# Patient Record
Sex: Female | Born: 1985 | Race: Black or African American | Hispanic: No | Marital: Single | Smoking: Current some day smoker
Health system: Southern US, Community
[De-identification: ages and names within clinical notes are randomized; demographics above are authoritative.]

## PROBLEM LIST (undated history)

## (undated) DIAGNOSIS — R87619 Unspecified abnormal cytological findings in specimens from cervix uteri: Secondary | ICD-10-CM

## (undated) DIAGNOSIS — I10 Essential (primary) hypertension: Secondary | ICD-10-CM

## (undated) DIAGNOSIS — D219 Benign neoplasm of connective and other soft tissue, unspecified: Secondary | ICD-10-CM

## (undated) DIAGNOSIS — D649 Anemia, unspecified: Secondary | ICD-10-CM

## (undated) HISTORY — DX: Essential (primary) hypertension: I10

## (undated) HISTORY — DX: Unspecified abnormal cytological findings in specimens from cervix uteri: R87.619

## (undated) HISTORY — DX: Anemia, unspecified: D64.9

## (undated) HISTORY — PX: SHOULDER ARTHROSCOPY: SHX128

## (undated) HISTORY — PX: LEEP: SHX91

## (undated) HISTORY — PX: MANDIBLE FRACTURE SURGERY: SHX706

---

## 2020-12-18 ENCOUNTER — Encounter (HOSPITAL_COMMUNITY): Payer: Self-pay

## 2020-12-18 ENCOUNTER — Emergency Department (HOSPITAL_COMMUNITY)
Admission: EM | Admit: 2020-12-18 | Discharge: 2020-12-18 | Disposition: A | Discharge status: Home / Self Care | Payer: PRIVATE HEALTH INSURANCE | Attending: Emergency Medicine | Admitting: Emergency Medicine

## 2020-12-18 ENCOUNTER — Other Ambulatory Visit: Payer: Self-pay

## 2020-12-18 DIAGNOSIS — R52 Pain, unspecified: Secondary | ICD-10-CM | POA: Diagnosis not present

## 2020-12-18 DIAGNOSIS — Z20822 Contact with and (suspected) exposure to covid-19: Secondary | ICD-10-CM | POA: Insufficient documentation

## 2020-12-18 NOTE — ED Triage Notes (Signed)
Pt reports having sore throat and body aches. Patient states that she had a COVID exposure and is requesting a test.

## 2020-12-18 NOTE — ED Provider Notes (Signed)
[facility] DEPT Provider Note   CSN: [id number] Arrival date & time: 12/18/20  2036     History No chief complaint on file.   Bernisha Verma is a 35 y.o. female.  35 y.o female with no PMH presents to the ED requesting COVID-19 testing.  According to patient, she is currently employed as a custodian at a school, states people that work with her tested positive for COVID-19 over the weekend.  She was last exposed to them last Friday.  She has been working on her own in the last couple days.  She does endorse overall body aches, which she has been taking naproxen for.  She does not have any URI symptoms at this time such as nasal congestion, cough, shortness of breath or chest pain.   The history is provided by the patient.       History reviewed. No pertinent past medical history.  There are no problems to display for this patient.   History reviewed. No pertinent surgical history.   OB History   No obstetric history on file.     History reviewed. No pertinent family history.     Home Medications Prior to Admission medications   Not on File    Allergies    Patient has no known allergies.  Review of Systems   Review of Systems  Constitutional: Negative for fever.  HENT: Negative for sore throat.   Respiratory: Negative for shortness of breath.   Cardiovascular: Negative for chest pain.  Gastrointestinal: Negative for abdominal pain, nausea and vomiting.  Genitourinary: Negative for flank pain.  Musculoskeletal: Negative for back pain.  Neurological: Negative for light-headedness and headaches.  All other systems reviewed and are negative.   Physical Exam Updated Vital Signs BP 128/82 (BP Location: Right Arm)   Pulse (!) 103   Temp 98.2 F (36.8 C) (Oral)   Resp 15   Ht 5\' 6"  (1.676 m)   Wt 55.3 kg   SpO2 100%   BMI 19.69 kg/m   Physical Exam Vitals and nursing note reviewed.  Constitutional:      Appearance:  Normal appearance. She is not ill-appearing.  HENT:     Head: Normocephalic and atraumatic.     Nose: Nose normal.     Mouth/Throat:     Mouth: Mucous membranes are moist.     Comments: Oropharynx without any erythema, no tonsillar exudates noted. Cardiovascular:     Rate and Rhythm: Normal rate.  Pulmonary:     Effort: Pulmonary effort is normal.     Comments: No wheezing, rhonchi, rales. Abdominal:     General: Abdomen is flat.     Tenderness: There is no abdominal tenderness. There is no right CVA tenderness or left CVA tenderness.  Musculoskeletal:     Cervical back: Normal range of motion and neck supple.  Skin:    General: Skin is warm and dry.  Neurological:     Mental Status: She is alert and oriented to person, place, and time.     ED Results / Procedures / Treatments   Labs (all labs ordered are listed, but only abnormal results are displayed) Labs Reviewed  SARS CORONAVIRUS 2 (TAT 6-24 HRS)    EKG None  Radiology No results found.  Procedures Procedures   Medications Ordered in ED Medications - No data to display  ED Course  I have reviewed the triage vital signs and the nursing notes.  Pertinent labs & imaging results that were available during  my care of the patient were reviewed by me and considered in my medical decision making (see chart for details).    MDM Rules/Calculators/A&P   Patient here for COVID-19 testing, reports exposure on Friday were both of her coworkers tested positive.  She does endorse body aches, has been taking naproxen for her complaints along with for recurrent pain of her fibroids.  She does not have any fever, no chest pain, no shortness of breath.  During evaluation she is overall well-appearing, vitals are within normal limits, she is satting at 100% on room air without any signs of tachypnea or tachycardia.  She is requesting a note in order to return back to work.  This was provided for patient.  She was also tested for  COVID-19 on todays visit.  Patient stable for discharge.  Portions of this note were generated with L[employer]. Dictation errors may occur despite best attempts at proofreading.  Final Clinical Impression(s) / ED Diagnoses Final diagnoses:  Encounter for laboratory testing for COVID-19 virus    Rx / DC Orders ED Discharge Orders    None       Janeece Fitting, Hershal Coria 12/18/20 2154    Milton Ferguson, MD 12/24/20 1222

## 2020-12-18 NOTE — Discharge Instructions (Addendum)
You were tested for COVID-19 on today's visit, these results should be available to you in the next 24 to 48 hours.  You were provided with a note in order to return back to work after your test results are given.  You may continue to treat your symptoms at home with symptomatic treatment such as Tylenol, increasing hydration.  If you are experience any chest pain, shortness of breath please return to the Emergency Department

## 2020-12-19 ENCOUNTER — Ambulatory Visit: Payer: Self-pay

## 2020-12-19 LAB — SARS CORONAVIRUS 2 (TAT 6-24 HRS): SARS Coronavirus 2: NEGATIVE

## 2021-04-15 ENCOUNTER — Emergency Department (HOSPITAL_COMMUNITY)
Admission: EM | Admit: 2021-04-15 | Discharge: 2021-04-16 | Disposition: A | Discharge status: Home / Self Care | Payer: Medicaid - Out of State | Attending: Emergency Medicine | Admitting: Emergency Medicine

## 2021-04-15 ENCOUNTER — Encounter (HOSPITAL_COMMUNITY): Payer: Self-pay | Admitting: Emergency Medicine

## 2021-04-15 ENCOUNTER — Other Ambulatory Visit: Payer: Self-pay

## 2021-04-15 DIAGNOSIS — N760 Acute vaginitis: Secondary | ICD-10-CM | POA: Diagnosis not present

## 2021-04-15 DIAGNOSIS — F17210 Nicotine dependence, cigarettes, uncomplicated: Secondary | ICD-10-CM | POA: Insufficient documentation

## 2021-04-15 DIAGNOSIS — R079 Chest pain, unspecified: Secondary | ICD-10-CM | POA: Insufficient documentation

## 2021-04-15 DIAGNOSIS — Z20822 Contact with and (suspected) exposure to covid-19: Secondary | ICD-10-CM | POA: Diagnosis not present

## 2021-04-15 DIAGNOSIS — B9689 Other specified bacterial agents as the cause of diseases classified elsewhere: Secondary | ICD-10-CM | POA: Diagnosis not present

## 2021-04-15 DIAGNOSIS — R0981 Nasal congestion: Secondary | ICD-10-CM | POA: Diagnosis not present

## 2021-04-15 DIAGNOSIS — R059 Cough, unspecified: Secondary | ICD-10-CM | POA: Diagnosis not present

## 2021-04-15 DIAGNOSIS — R197 Diarrhea, unspecified: Secondary | ICD-10-CM | POA: Diagnosis not present

## 2021-04-15 DIAGNOSIS — R1110 Vomiting, unspecified: Secondary | ICD-10-CM | POA: Insufficient documentation

## 2021-04-15 DIAGNOSIS — R070 Pain in throat: Secondary | ICD-10-CM | POA: Insufficient documentation

## 2021-04-15 DIAGNOSIS — R102 Pelvic and perineal pain: Secondary | ICD-10-CM | POA: Insufficient documentation

## 2021-04-15 DIAGNOSIS — N898 Other specified noninflammatory disorders of vagina: Secondary | ICD-10-CM | POA: Diagnosis present

## 2021-04-15 HISTORY — DX: Benign neoplasm of connective and other soft tissue, unspecified: D21.9

## 2021-04-15 LAB — COMPREHENSIVE METABOLIC PANEL
ALT: 22 U/L (ref 0–44)
AST: 28 U/L (ref 15–41)
Albumin: 4.2 g/dL (ref 3.5–5.0)
Alkaline Phosphatase: 52 U/L (ref 38–126)
Anion gap: 10 (ref 5–15)
BUN: 11 mg/dL (ref 6–20)
CO2: 28 mmol/L (ref 22–32)
Calcium: 9.4 mg/dL (ref 8.9–10.3)
Chloride: 103 mmol/L (ref 98–111)
Creatinine, Ser: 0.59 mg/dL (ref 0.44–1.00)
GFR, Estimated: >60 mL/min (ref >60)
Glucose, Bld: 92 mg/dL (ref 70–99)
Potassium: 3.8 mmol/L (ref 3.5–5.1)
Sodium: 141 mmol/L (ref 135–145)
Total Bilirubin: 0.5 mg/dL (ref 0.3–1.2)
Total Protein: 7.7 g/dL (ref 6.5–8.1)

## 2021-04-15 LAB — URINALYSIS, ROUTINE W REFLEX MICROSCOPIC
Bilirubin Urine: NEGATIVE
Glucose, UA: NEGATIVE mg/dL
Hgb urine dipstick: NEGATIVE
Ketones, ur: NEGATIVE mg/dL
Leukocytes,Ua: NEGATIVE
Nitrite: NEGATIVE
Specific Gravity, Urine: >1.03 — ABNORMAL HIGH (ref 1.005–1.030)
pH: 6 (ref 5.0–8.0)

## 2021-04-15 LAB — URINALYSIS, MICROSCOPIC (REFLEX)

## 2021-04-15 LAB — CBC
HCT: 34.3 % — ABNORMAL LOW (ref 36.0–46.0)
Hemoglobin: 11 g/dL — ABNORMAL LOW (ref 12.0–15.0)
MCH: 31.1 pg (ref 26.0–34.0)
MCHC: 32.1 g/dL (ref 30.0–36.0)
MCV: 96.9 fL (ref 80.0–100.0)
Platelets: 373 10*3/uL (ref 150–400)
RBC: 3.54 MIL/uL — ABNORMAL LOW (ref 3.87–5.11)
RDW: 19.1 % — ABNORMAL HIGH (ref 11.5–15.5)
WBC: 7.9 10*3/uL (ref 4.0–10.5)
nRBC: 0 % (ref 0.0–0.2)

## 2021-04-15 LAB — RESP PANEL BY RT-PCR (FLU A&B, COVID) ARPGX2
Influenza A by PCR: NEGATIVE
Influenza B by PCR: NEGATIVE
SARS Coronavirus 2 by RT PCR: NEGATIVE

## 2021-04-15 LAB — I-STAT BETA HCG BLOOD, ED (MC, WL, AP ONLY): I-stat hCG, quantitative: <5 m[IU]/mL (ref <5)

## 2021-04-15 LAB — LIPASE, BLOOD: Lipase: 37 U/L (ref 11–51)

## 2021-04-15 NOTE — ED Triage Notes (Signed)
Pt states she's had cough/runny nose/headache x 1.5 weeks. Also c/o vaginal d/c that has a foul odor. Alert and oriented.

## 2021-04-16 ENCOUNTER — Emergency Department (HOSPITAL_COMMUNITY): Payer: Medicaid - Out of State

## 2021-04-16 LAB — WET PREP, GENITAL
Sperm: NONE SEEN
Trich, Wet Prep: NONE SEEN
Yeast Wet Prep HPF POC: NONE SEEN

## 2021-04-16 LAB — GC/CHLAMYDIA PROBE AMP (~~LOC~~) NOT AT ARMC
Chlamydia: NEGATIVE
Comment: NEGATIVE
Comment: NORMAL
Neisseria Gonorrhea: NEGATIVE

## 2021-04-16 MED ORDER — BENZONATATE 100 MG PO CAPS: 100.0000 mg | ORAL_CAPSULE | Freq: Three times a day (TID) | ORAL | 0 refills | Status: DC | PRN

## 2021-04-16 MED ORDER — NAPROXEN 500 MG PO TABS: 500.0000 mg | ORAL_TABLET | Freq: Two times a day (BID) | ORAL | 0 refills | Status: DC | PRN

## 2021-04-16 MED ORDER — METRONIDAZOLE 500 MG PO TABS: 500.0000 mg | ORAL_TABLET | Freq: Two times a day (BID) | ORAL | 0 refills | Status: DC

## 2021-04-16 MED ORDER — ONDANSETRON 4 MG PO TBDP: 4.0000 mg | ORAL_TABLET | Freq: Three times a day (TID) | ORAL | 0 refills | Status: DC | PRN

## 2021-04-16 NOTE — ED Provider Notes (Signed)
[facility] DEPT Provider Note   CSN: [id number] Arrival date & time: 04/15/21  1604     History Chief Complaint  Patient presents with   Cough   Vaginal Discharge    Haley Hogan is a 35 y.o. female with a history of tobacco abuse and fibroids who presents to the emergency department with complaints of flulike symptoms for the past 1.5 weeks as well as vaginal discharge for 2 weeks.  Patient states she has been experiencing congestion, sore throat, ear pain, cough, chest pain with coughing, vomiting, and diarrhea.  Has been keeping down some fluids and food, not having any nausea at present.  No specific alleviating or aggravating factors to the symptoms.  She is unsure if she could have COVID.  She also mentions that she is had 2 weeks of vaginal discharge that is malodorous with some intermittent pelvic cramping, she states that the pelvic pain sometimes feels like her fibroids and sometimes feels different.  She is sexually active in a monogamous relationship without concern for STI.  She denies fever, hematemesis, melena, hematochezia, dysuria, urgency, or vaginal bleeding.  HPI     Past Medical History:  Diagnosis Date   Fibroids     There are no problems to display for this patient.   Past Surgical History:  Procedure Laterality Date   MANDIBLE FRACTURE SURGERY     SHOULDER ARTHROSCOPY       OB History   No obstetric history on file.     History reviewed. No pertinent family history.  Social History   Tobacco Use   Smoking status: Every Day    Types: Cigarettes   Smokeless tobacco: Never  Vaping Use   Vaping Use: Never used  Substance Use Topics   Alcohol use: Not Currently   Drug use: Not Currently    Home Medications Prior to Admission medications   Not on File    Allergies    Other and Penicillins  Review of Systems   Review of Systems  Constitutional:  Positive for fatigue. Negative for fever.  HENT:   Positive for congestion, ear pain and sore throat.   Respiratory:  Positive for cough.   Cardiovascular:  Positive for chest pain (w/ coughing). Negative for leg swelling.  Gastrointestinal:  Positive for abdominal pain, diarrhea, nausea and vomiting. Negative for anal bleeding and blood in stool.  Genitourinary:  Positive for vaginal discharge. Negative for dysuria and vaginal bleeding.  Neurological:  Negative for syncope.  All other systems reviewed and are negative.  Physical Exam Updated Vital Signs BP 139/63   Pulse 66   Temp 99.4 F (37.4 C) (Oral)   Resp 20   Ht 5\' 7"  (1.702 m)   Wt 54.4 kg   LMP 03/22/2021   SpO2 100%   BMI 18.79 kg/m   Physical Exam Vitals and nursing note reviewed.  Constitutional:      General: She is not in acute distress.    Appearance: She is well-developed.  HENT:     Head: Normocephalic and atraumatic.     Right Ear: Ear canal normal. Tympanic membrane is not perforated, erythematous, retracted or bulging.     Left Ear: Ear canal normal. Tympanic membrane is not perforated, erythematous, retracted or bulging.     Ears:     Comments: No mastoid erythema/swelling/tenderness.     Nose:     Right Sinus: No maxillary sinus tenderness or frontal sinus tenderness.     Left Sinus: No maxillary  sinus tenderness or frontal sinus tenderness.     Mouth/Throat:     Pharynx: Uvula midline. No oropharyngeal exudate or posterior oropharyngeal erythema.     Comments: Posterior oropharynx is symmetric appearing. Patient tolerating own secretions without difficulty. No trismus. No drooling. No hot potato voice. No swelling beneath the tongue, submandibular compartment is soft.  Eyes:     General:        Right eye: No discharge.        Left eye: No discharge.     Conjunctiva/sclera: Conjunctivae normal.     Pupils: Pupils are equal, round, and reactive to light.  Cardiovascular:     Rate and Rhythm: Normal rate and regular rhythm.     Heart sounds: No  murmur heard. Pulmonary:     Effort: Pulmonary effort is normal. No respiratory distress.     Breath sounds: Normal breath sounds. No wheezing, rhonchi or rales.  Abdominal:     General: There is no distension.     Palpations: Abdomen is soft.     Tenderness: There is no abdominal tenderness.  Genitourinary:    Comments: Chaperone present- RN No external lesions.  No bleeding. Mild discharge. R adnexal tenderness. No CMT.  Musculoskeletal:     Cervical back: Normal range of motion and neck supple. No edema or rigidity.     Right lower leg: No edema.     Left lower leg: No edema.  Lymphadenopathy:     Cervical: No cervical adenopathy.  Skin:    General: Skin is warm and dry.     Findings: No rash.  Neurological:     Mental Status: She is alert.  Psychiatric:        Behavior: Behavior normal.    ED Results / Procedures / Treatments   Labs (all labs ordered are listed, but only abnormal results are displayed) Labs Reviewed  WET PREP, GENITAL - Abnormal; Notable for the following components:      Result Value   Clue Cells Wet Prep HPF POC PRESENT (*)    WBC, Wet Prep HPF POC RARE (*)    All other components within normal limits  CBC - Abnormal; Notable for the following components:   RBC 3.54 (*)    Hemoglobin 11.0 (*)    HCT 34.3 (*)    RDW 19.1 (*)    All other components within normal limits  URINALYSIS, ROUTINE W REFLEX MICROSCOPIC - Abnormal; Notable for the following components:   Specific Gravity, Urine >1.030 (*)    Protein, ur TRACE (*)    All other components within normal limits  URINALYSIS, MICROSCOPIC (REFLEX) - Abnormal; Notable for the following components:   Bacteria, UA RARE (*)    All other components within normal limits  RESP PANEL BY RT-PCR (FLU A&B, COVID) ARPGX2  LIPASE, BLOOD  COMPREHENSIVE METABOLIC PANEL  I-STAT BETA HCG BLOOD, ED ([facility], [facility], [facility] ONLY)  GC/CHLAMYDIA PROBE AMP ([place]) NOT AT [facility]    EKG None  Radiology DG Chest 2  View  Result Date: 04/16/2021 CLINICAL DATA:  Cough.  Nasal congestion. EXAM: CHEST - 2 VIEW COMPARISON:  None. FINDINGS: The cardiomediastinal contours are normal. The lungs are clear. Pulmonary vasculature is normal. No consolidation, pleural effusion, or pneumothorax. No acute osseous abnormalities are seen. IMPRESSION: Negative radiographs of the chest. Electronically Signed   By: Keith Rake M.D.   On: 04/16/2021 01:57   US Transvaginal Non-OB  Result Date: 04/16/2021 CLINICAL DATA:  Initial evaluation for acute  pelvic pain for 1 week. EXAM: TRANSABDOMINAL AND TRANSVAGINAL ULTRASOUND OF PELVIS DOPPLER ULTRASOUND OF OVARIES TECHNIQUE: Both transabdominal and transvaginal ultrasound examinations of the pelvis were performed. Transabdominal technique was performed for global imaging of the pelvis including uterus, ovaries, adnexal regions, and pelvic cul-de-sac. It was necessary to proceed with endovaginal exam following the transabdominal exam to visualize the uterus, endometrium, and ovaries. Color and duplex Doppler ultrasound was utilized to evaluate blood flow to the ovaries. COMPARISON:  None. FINDINGS: Uterus Measurements: 13.0 x 8.4 x 9.7 cm = volume: 555.5 mL. Uterus is anteverted with several fibroids seen as follows; 1. 7.4 x 6.9 x 7.6 cm exophytic fibroid extends from the uterine fundus. 2. 5.6 x 4.4 x 5.7 cm exophytic fibroid extends from the left uterine fundus. 3. 4.7 x 4.1 x 4.7 cm intramural fibroid at the left uterine body. Endometrium Not visualized, obscured by overlying fibroids. Right ovary Measurements: 3.2 x 2.6 x 2.9 cm = volume: 10.1 mL. 1.1 cm degenerating right ovarian corpus luteal cyst. No other adnexal mass. Left ovary Measurements: 2.3 x 1.5 x 1.8 cm = volume: 3.3 mL. Normal appearance/no adnexal mass. Pulsed Doppler evaluation of both ovaries demonstrates normal low-resistance arterial and venous waveforms. Other findings No abnormal free fluid. IMPRESSION: 1. Enlarged  fibroid uterus as detailed above. 2. 1.1 cm degenerating right ovarian corpus luteal cyst. Finding could contribute to acute pelvic pain. No evidence for ovarian torsion. 3. Normal left ovary.  No other adnexal mass or free fluid. 4. Nonvisualization of the endometrium, obscured by overlying fibroids. Electronically Signed   By: Jeannine Boga M.D.   On: 04/16/2021 03:51   US Pelvis Complete  Result Date: 04/16/2021 CLINICAL DATA:  Initial evaluation for acute pelvic pain for 1 week. EXAM: TRANSABDOMINAL AND TRANSVAGINAL ULTRASOUND OF PELVIS DOPPLER ULTRASOUND OF OVARIES TECHNIQUE: Both transabdominal and transvaginal ultrasound examinations of the pelvis were performed. Transabdominal technique was performed for global imaging of the pelvis including uterus, ovaries, adnexal regions, and pelvic cul-de-sac. It was necessary to proceed with endovaginal exam following the transabdominal exam to visualize the uterus, endometrium, and ovaries. Color and duplex Doppler ultrasound was utilized to evaluate blood flow to the ovaries. COMPARISON:  None. FINDINGS: Uterus Measurements: 13.0 x 8.4 x 9.7 cm = volume: 555.5 mL. Uterus is anteverted with several fibroids seen as follows; 1. 7.4 x 6.9 x 7.6 cm exophytic fibroid extends from the uterine fundus. 2. 5.6 x 4.4 x 5.7 cm exophytic fibroid extends from the left uterine fundus. 3. 4.7 x 4.1 x 4.7 cm intramural fibroid at the left uterine body. Endometrium Not visualized, obscured by overlying fibroids. Right ovary Measurements: 3.2 x 2.6 x 2.9 cm = volume: 10.1 mL. 1.1 cm degenerating right ovarian corpus luteal cyst. No other adnexal mass. Left ovary Measurements: 2.3 x 1.5 x 1.8 cm = volume: 3.3 mL. Normal appearance/no adnexal mass. Pulsed Doppler evaluation of both ovaries demonstrates normal low-resistance arterial and venous waveforms. Other findings No abnormal free fluid. IMPRESSION: 1. Enlarged fibroid uterus as detailed above. 2. 1.1 cm degenerating  right ovarian corpus luteal cyst. Finding could contribute to acute pelvic pain. No evidence for ovarian torsion. 3. Normal left ovary.  No other adnexal mass or free fluid. 4. Nonvisualization of the endometrium, obscured by overlying fibroids. Electronically Signed   By: Jeannine Boga M.D.   On: 04/16/2021 03:51   [place] Art/Ven Flow Abd Pelv Doppler  Result Date: 04/16/2021 CLINICAL DATA:  Initial evaluation for acute pelvic pain for 1 week.  EXAM: TRANSABDOMINAL AND TRANSVAGINAL ULTRASOUND OF PELVIS DOPPLER ULTRASOUND OF OVARIES TECHNIQUE: Both transabdominal and transvaginal ultrasound examinations of the pelvis were performed. Transabdominal technique was performed for global imaging of the pelvis including uterus, ovaries, adnexal regions, and pelvic cul-de-sac. It was necessary to proceed with endovaginal exam following the transabdominal exam to visualize the uterus, endometrium, and ovaries. Color and duplex Doppler ultrasound was utilized to evaluate blood flow to the ovaries. COMPARISON:  None. FINDINGS: Uterus Measurements: 13.0 x 8.4 x 9.7 cm = volume: 555.5 mL. Uterus is anteverted with several fibroids seen as follows; 1. 7.4 x 6.9 x 7.6 cm exophytic fibroid extends from the uterine fundus. 2. 5.6 x 4.4 x 5.7 cm exophytic fibroid extends from the left uterine fundus. 3. 4.7 x 4.1 x 4.7 cm intramural fibroid at the left uterine body. Endometrium Not visualized, obscured by overlying fibroids. Right ovary Measurements: 3.2 x 2.6 x 2.9 cm = volume: 10.1 mL. 1.1 cm degenerating right ovarian corpus luteal cyst. No other adnexal mass. Left ovary Measurements: 2.3 x 1.5 x 1.8 cm = volume: 3.3 mL. Normal appearance/no adnexal mass. Pulsed Doppler evaluation of both ovaries demonstrates normal low-resistance arterial and venous waveforms. Other findings No abnormal free fluid. IMPRESSION: 1. Enlarged fibroid uterus as detailed above. 2. 1.1 cm degenerating right ovarian corpus luteal cyst. Finding  could contribute to acute pelvic pain. No evidence for ovarian torsion. 3. Normal left ovary.  No other adnexal mass or free fluid. 4. Nonvisualization of the endometrium, obscured by overlying fibroids. Electronically Signed   By: Jeannine Boga M.D.   On: 04/16/2021 03:51    Procedures Procedures   Medications Ordered in ED Medications - No data to display  ED Course  I have reviewed the triage vital signs and the nursing notes.  Pertinent labs & imaging results that were available during my care of the patient were reviewed by me and considered in my medical decision making (see chart for details).    MDM Rules/Calculators/A&P                           Patient presents to the ED with flu like sxs x 1.5 weeks and vaginal discharge x 2 weeks. Patient is nontoxic, in no acute distress, vitals notable for with initial tachycardia resolved and hypertension- doubt HTN emergency.   Additional history obtained:  Additional history obtained from chart review & nursing note review.   Lab Tests:  I reviewed and interpreted labs, which included:  CBC: Mild anemia.  CMP: unremarkable.  Lipase: WNL UA: elevated specific gravity. Proteinuria.   Imaging Studies ordered:  I ordered imaging studies which included CXR & [place], I independently visualized and interpreted imaging which showed:  CXR: Negative radiographs of the chest Pelvis US: 1. Enlarged fibroid uterus as detailed above. 2. 1.1 cm degenerating right ovarian corpus luteal cyst. Finding could contribute to acute pelvic pain. No evidence for ovarian torsion. 3. Normal left ovary.  No other adnexal mass or free fluid. 4. Nonvisualization of the endometrium, obscured by overlying fibroids  ED Course:  Exam is without signs of AOM, AOE, or mastoiditis. Oropharyngeal exam is benign. No sinus tenderness. No meningeal signs. Lungs are CTA without focal adventitious sounds, no signs of increased work of breathing, CXR without infiltrate ,  doubt CAP. Abdomen nontender w/o peritoneal signs- doubt acute surgical process. Pelvic US without torsion- R sided cyst noted as well as patient's fibroids. No concern for STI by  patient, low suspicion for PID. Wet prep with BV-will tx. Preg test negative.   Plan for supportive care & flagyl for BV. Well appearing, tolerating PO, reassuring work-up and requesting to go home with work note until Monday, appears appropriate for discharge. I discussed results, treatment plan, need for follow-up, and return precautions with the patient. Provided opportunity for questions, patient confirmed understanding and is in agreement with plan.   Portions of this note were generated with [employer]. Dictation errors may occur despite best attempts at proofreading.  Final Clinical Impression(s) / ED Diagnoses Final diagnoses:  Bacterial vaginosis  Cough    Rx / DC Orders ED Discharge Orders          Ordered    metroNIDAZOLE (FLAGYL) 500 MG tablet  2 times daily        04/16/21 0403    naproxen (NAPROSYN) 500 MG tablet  2 times daily PRN        04/16/21 0403    ondansetron (ZOFRAN ODT) 4 MG disintegrating tablet  Every 8 hours PRN        04/16/21 0403    benzonatate (TESSALON) 100 MG capsule  3 times daily PRN        04/16/21 0403             Vonette Grosso, Glynda Jaeger, PA-C 04/16/21 0411    Palumbo, April, MD 04/16/21 [phone]

## 2021-04-16 NOTE — Discharge Instructions (Addendum)
You are seen in the emergency department today for flulike symptoms as well as vaginal discharge.  Your work-up was overall reassuring.  Your blood work showed that you are mildly anemic that you have some protein in your urine, please have these rechecked by your primary care provider.  Your COVID and flu test were negative.  Your chest x-ray did not show pneumonia.  Your ultrasound showed fibroids and a right sided ovarian cyst which could be contributing to your pelvic pain.  Your wet prep showed bacterial vaginosis.  We are sending you home with the following medicines to treat your symptoms: - Naproxen: Please take every 12 hours as needed for pain.  Take this with food as it can cause stomach upset and or stomach bleeding.  Do not take other NSAIDs with this medicine - Tessalon: Take every hours as needed for coughing - Zofran: Take every hours as needed for nausea and vomiting - Flagyl: Take twice per day for the next 1 week for bacterial vaginosis.  Do not drink alcohol with this medication as it is very dangerous.  We have prescribed you new medication(s) today. Discuss the medications prescribed today with your pharmacist as they can have adverse effects and interactions with your other medicines including over the counter and prescribed medications. Seek medical evaluation if you start to experience new or abnormal symptoms after taking one of these medicines, seek care immediately if you start to experience difficulty breathing, feeling of your throat closing, facial swelling, or rash as these could be indications of a more serious allergic reaction  Follow-up with primary care as well as women's health as soon as possible.  Return to the emergency department for new or worsening symptoms including but not limited to new or worsening pain, inability to keep fluids down, fever, passing out, or any other concerns.

## 2021-04-16 NOTE — ED Notes (Signed)
US at bedside

## 2021-04-16 NOTE — ED Notes (Signed)
Discharge instructions discussed with pt. Pt verbalized understanding with no questions at this time. Pt provided with work note

## 2022-08-25 IMAGING — CR DG CHEST 2V
2 series · 2 of 2 positions shown · non-contrast
Comparison: None.

CLINICAL DATA: Cough.  Nasal congestion.

EXAM:
CHEST - 2 VIEW

[w chest pa]
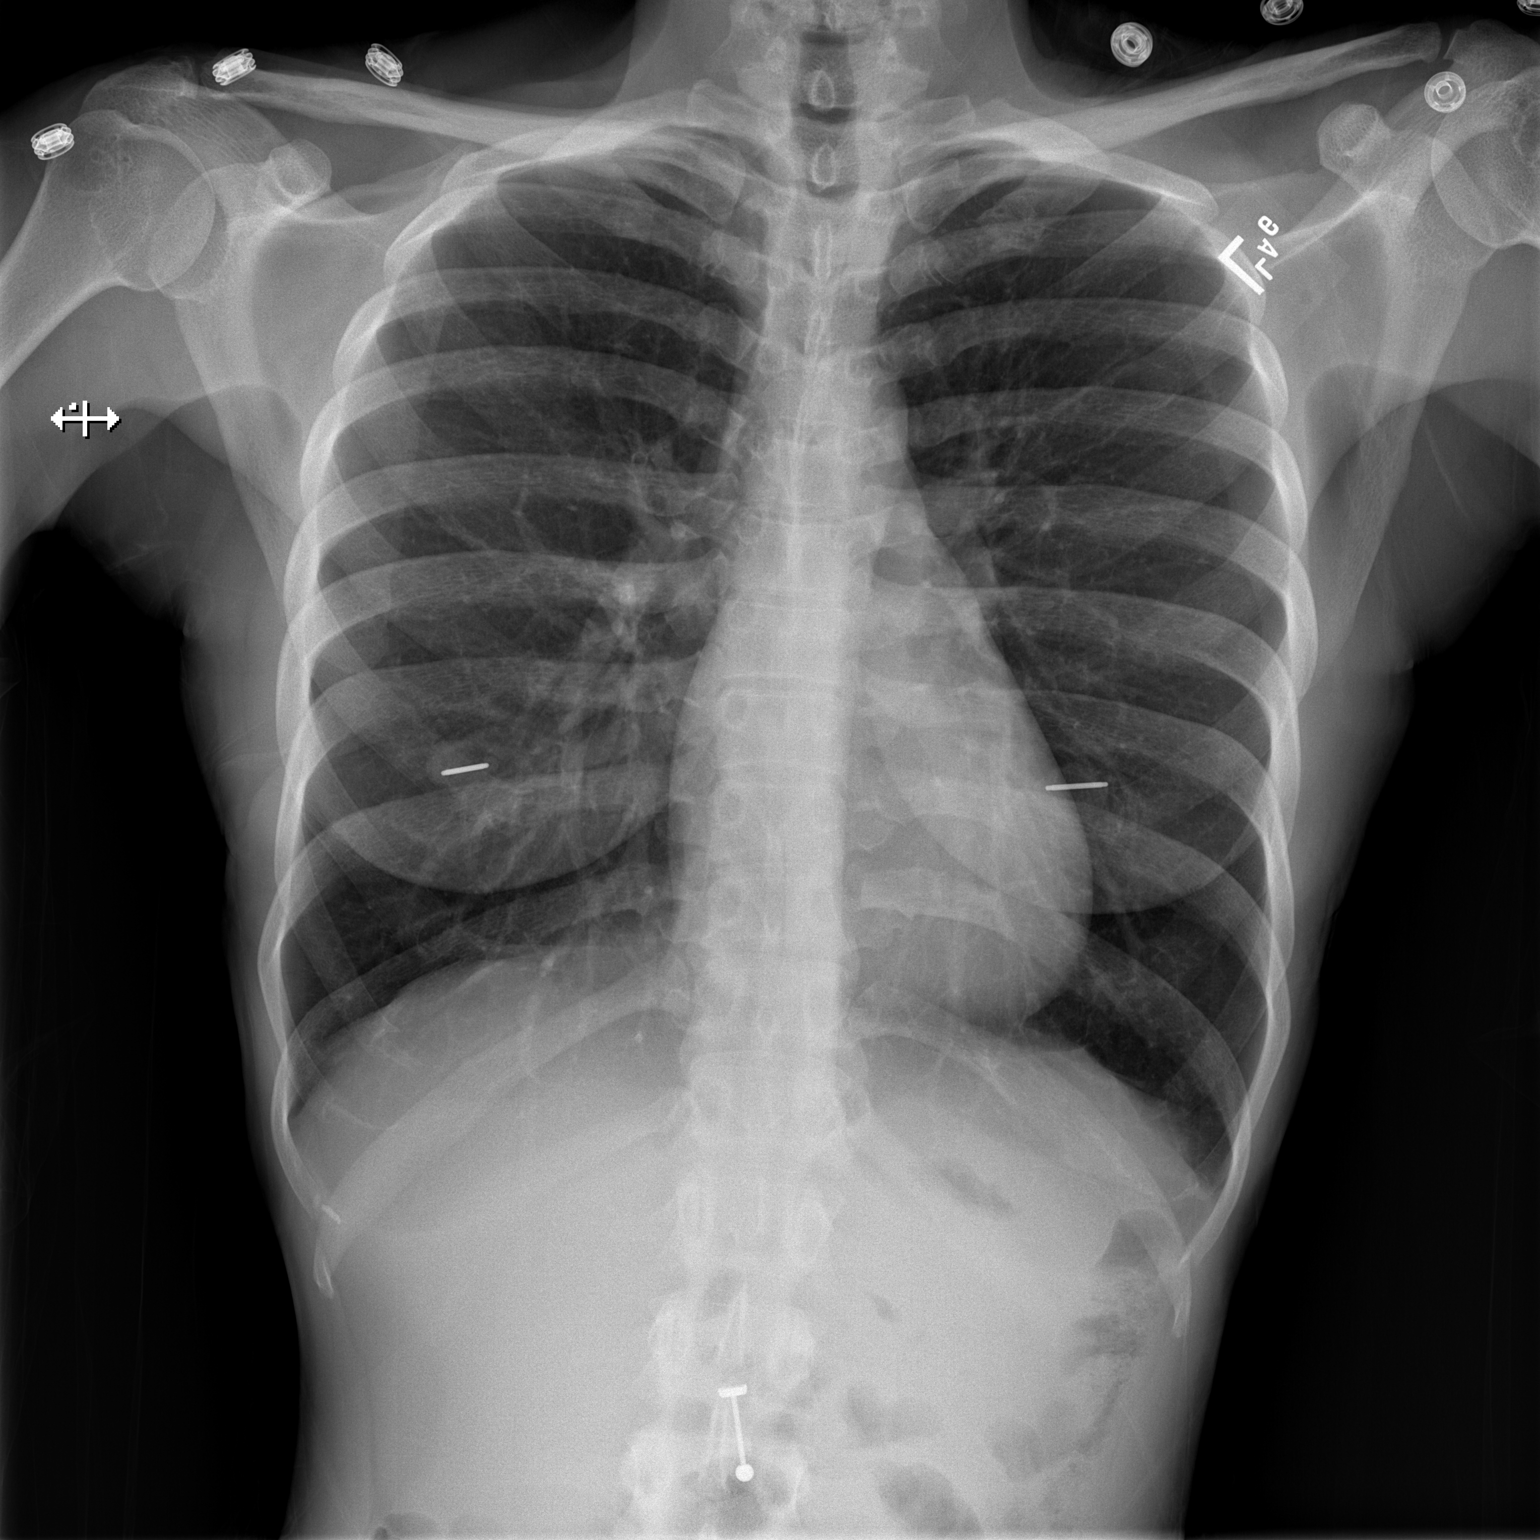

[w chest lat]
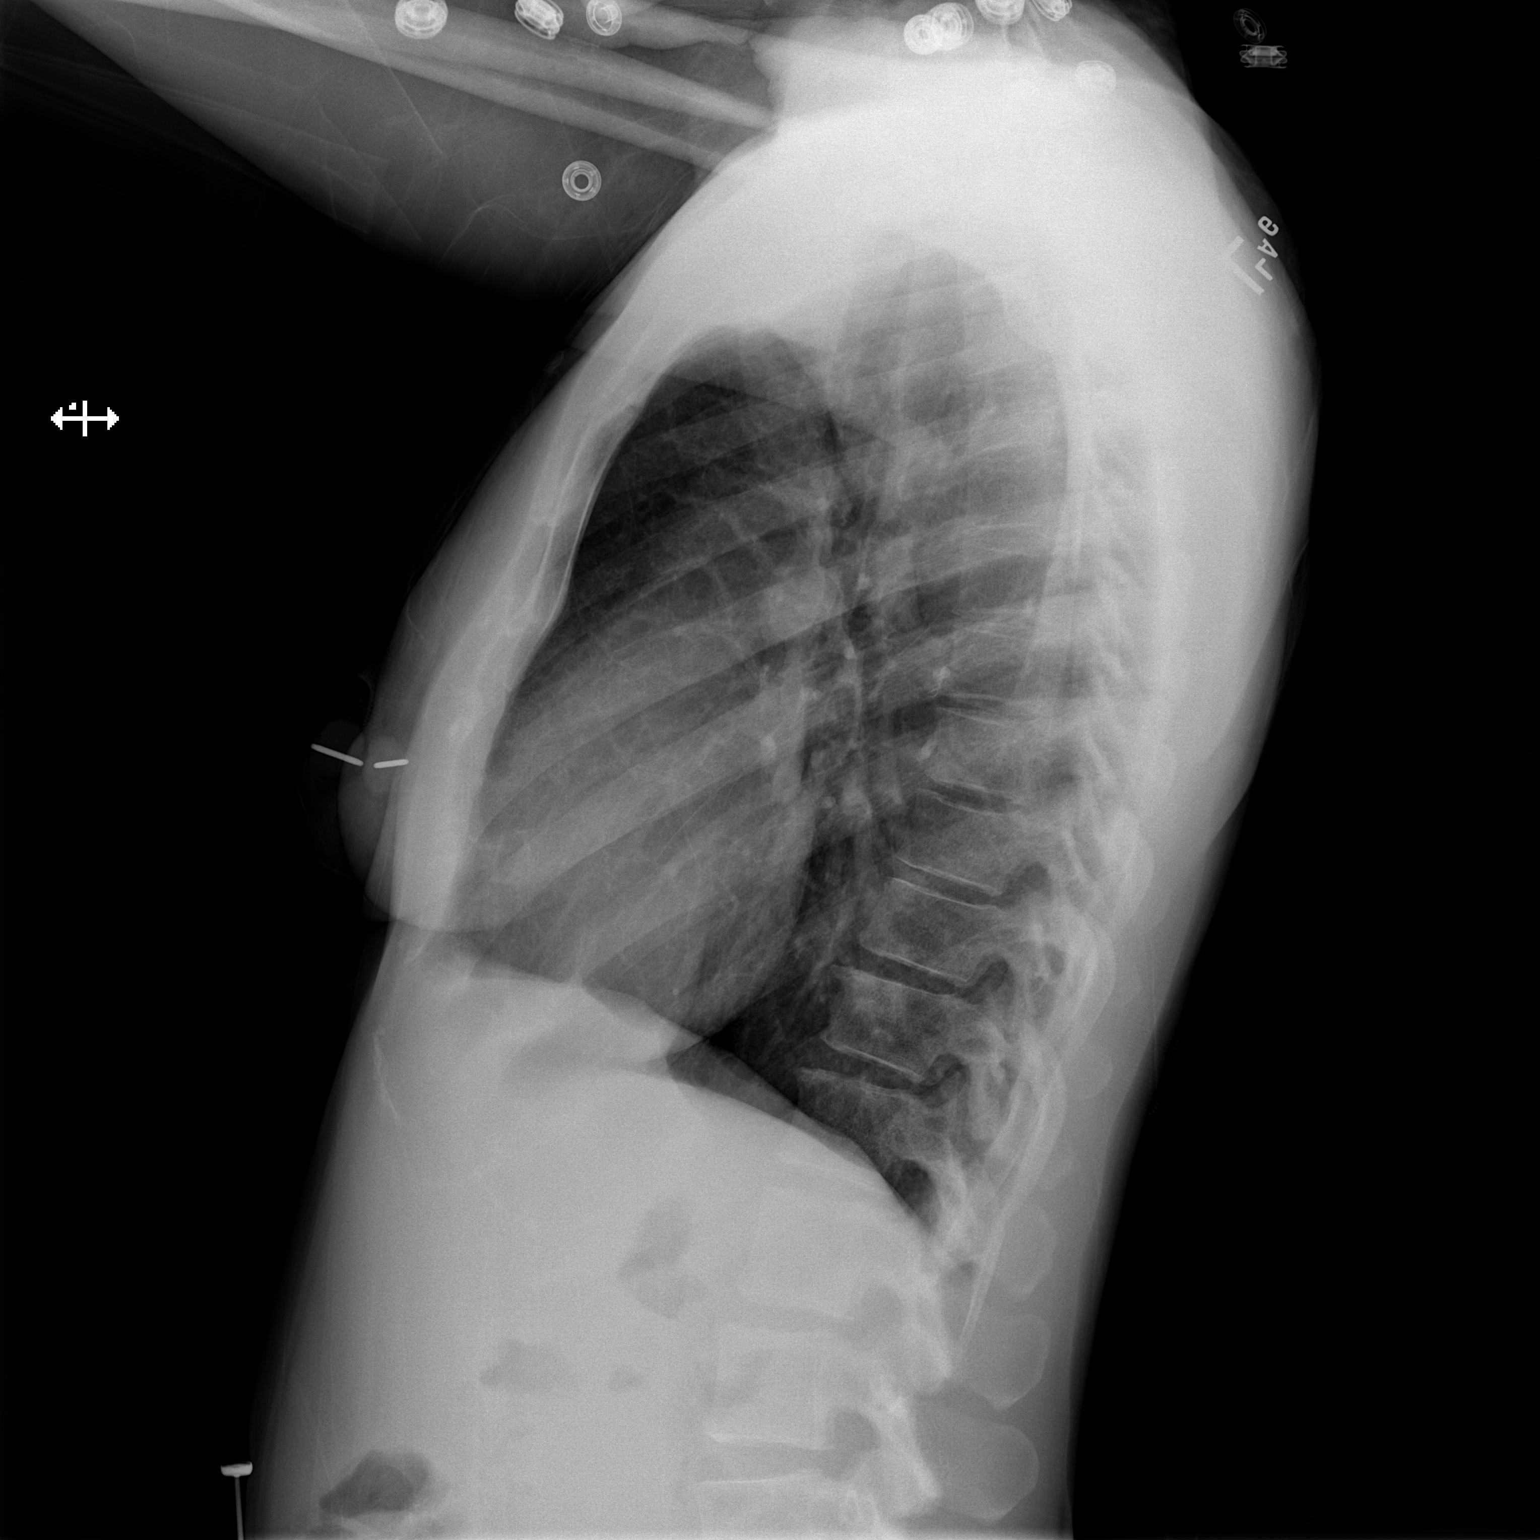

[2 of 2 positions shown; findings below may reference images not displayed]

FINDINGS: The cardiomediastinal contours are normal. The lungs are clear.
Pulmonary vasculature is normal. No consolidation, pleural effusion,
or pneumothorax. No acute osseous abnormalities are seen.
IMPRESSION: Negative radiographs of the chest.

## 2022-08-25 IMAGING — US US TRANSVAGINAL NON-OB
1 series · 13 of 25 positions shown · non-contrast
Comparison: None.

CLINICAL DATA: Initial evaluation for acute pelvic pain for 1 week.

EXAM:
TRANSABDOMINAL AND TRANSVAGINAL ULTRASOUND OF PELVIS
DOPPLER ULTRASOUND OF OVARIES
TECHNIQUE: Both transabdominal and transvaginal ultrasound examinations of the
pelvis were performed. Transabdominal technique was performed for
global imaging of the pelvis including uterus, ovaries, adnexal
regions, and pelvic cul-de-sac.
It was necessary to proceed with endovaginal exam following the
transabdominal exam to visualize the uterus, endometrium, and
ovaries. Color and duplex Doppler ultrasound was utilized to
evaluate blood flow to the ovaries.

[Series 1: us transvaginal non-ob · 13 of 92 slices shown]
[im 1/92]
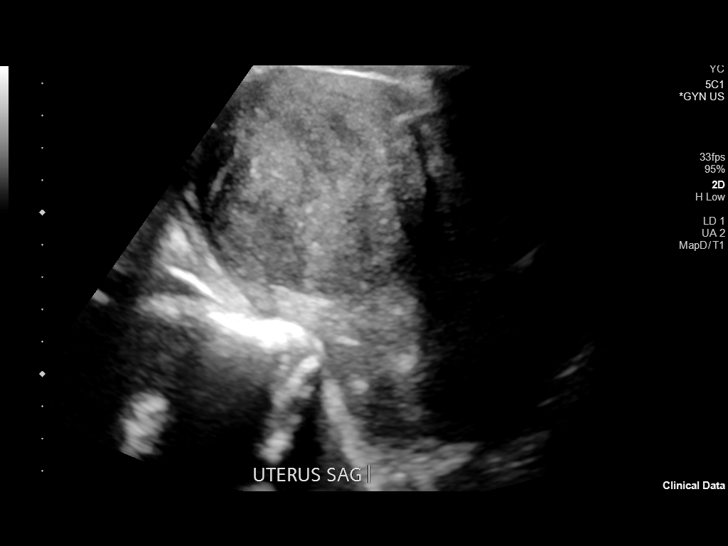
[im 8/92]
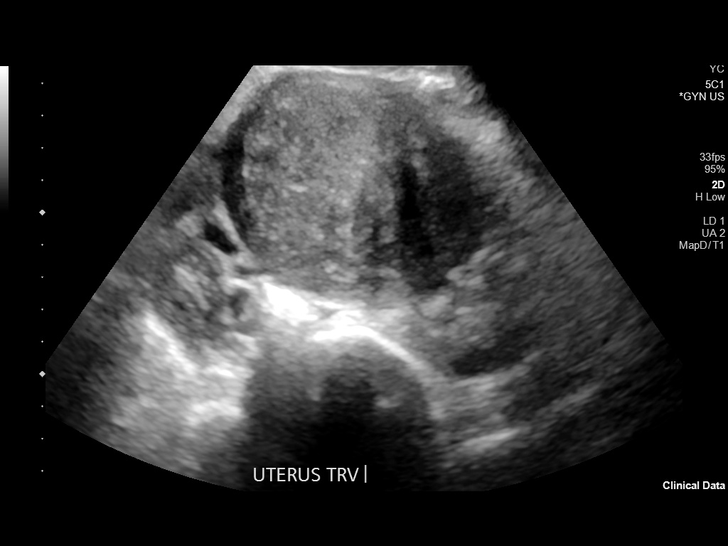
[im 16/92]
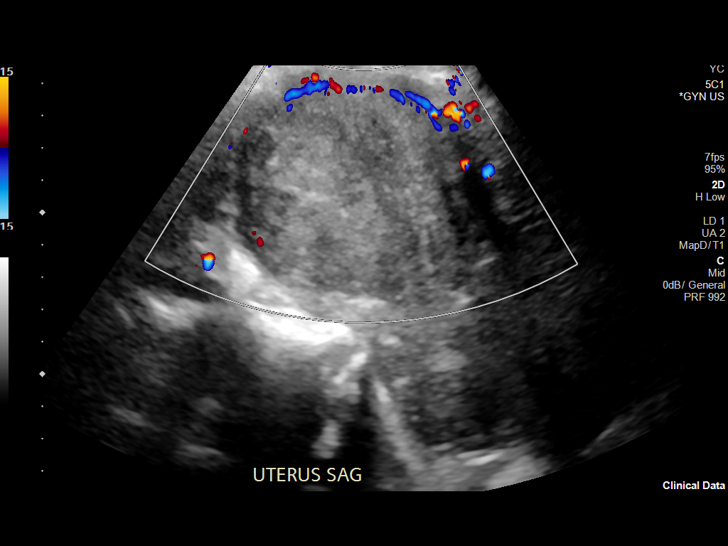
[im 23/92]
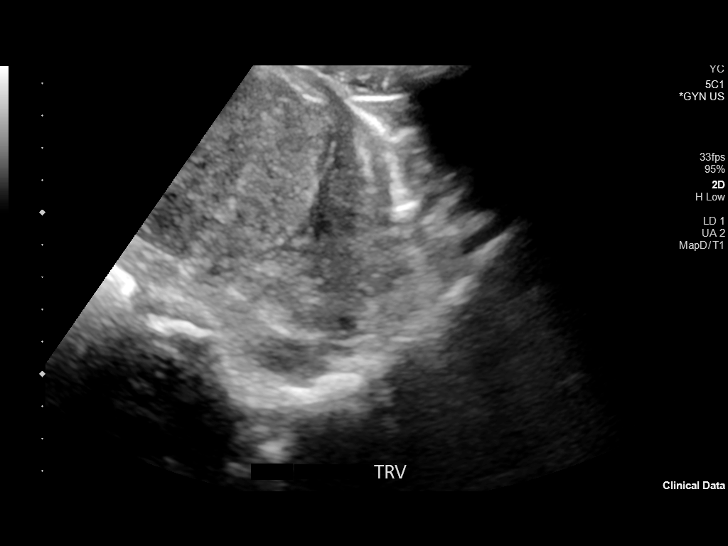
[im 31/92]
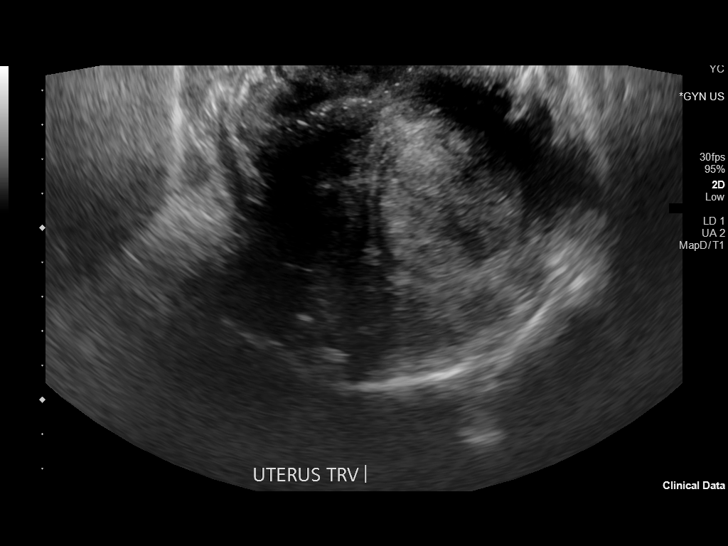
[im 38/92]
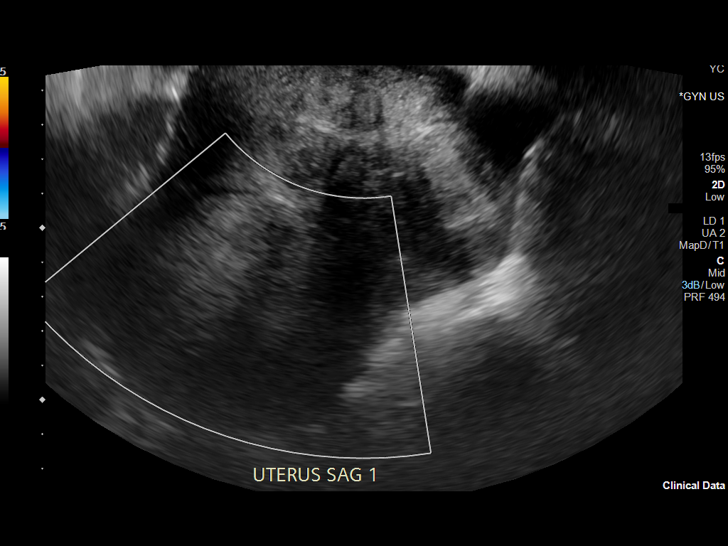
[im 46/92]
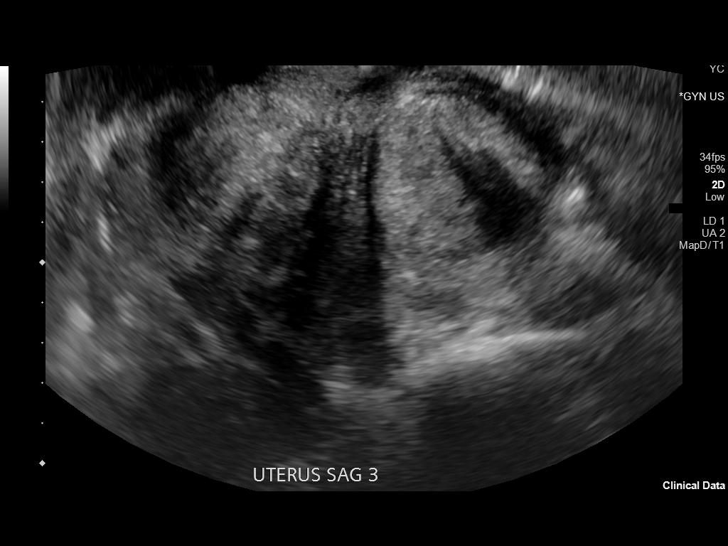
[im 54/92]
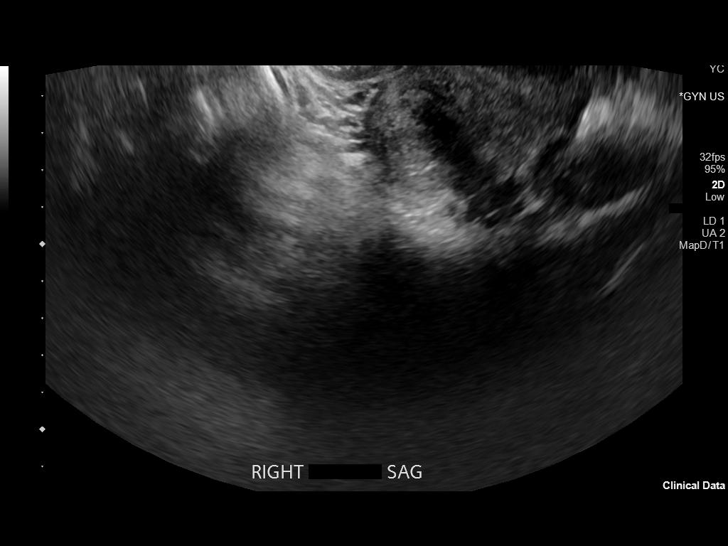
[im 61/92]
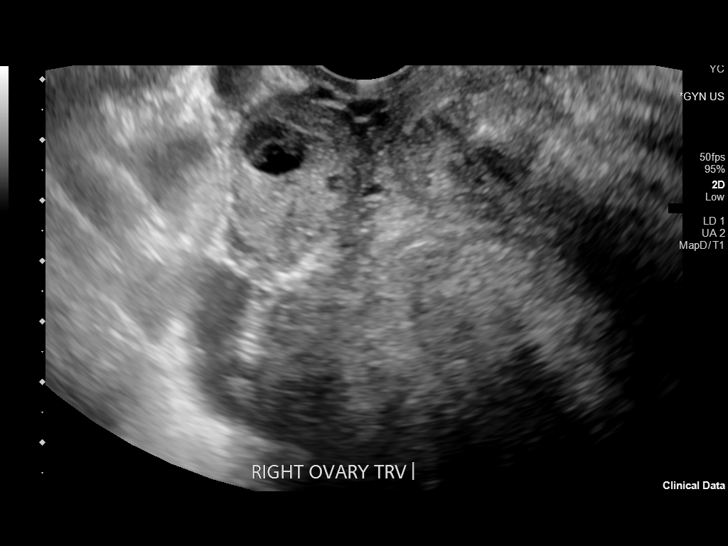
[im 69/92]
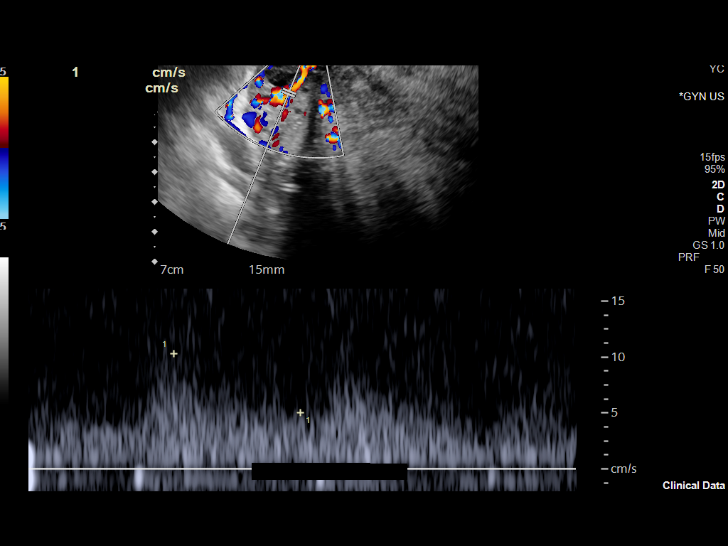
[im 76/92]
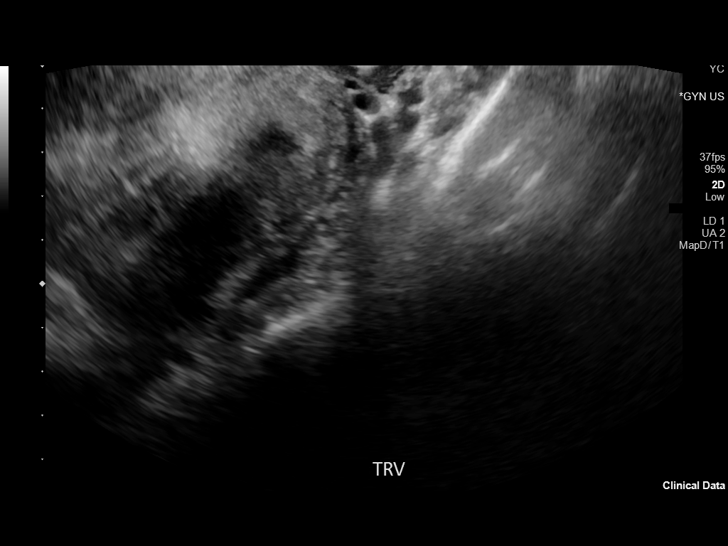
[im 84/92]
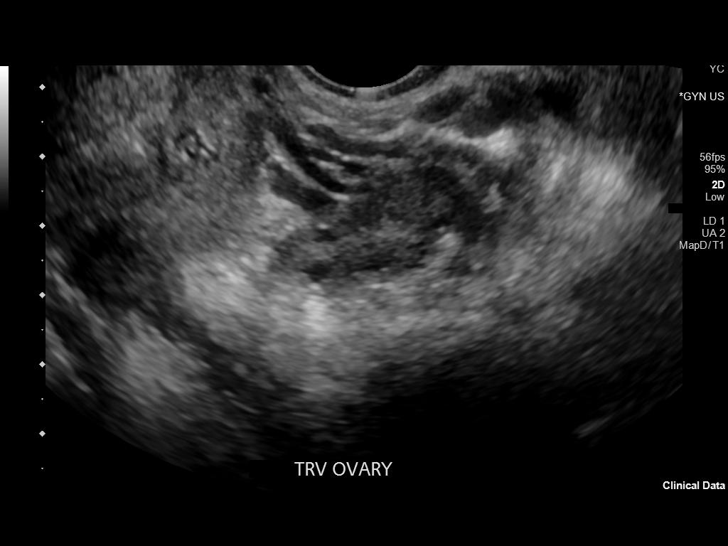
[im 92/92]
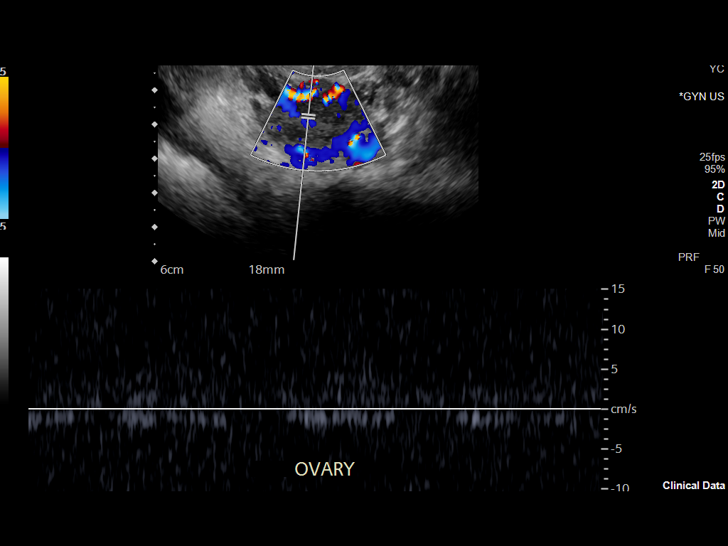

[13 of 25 positions shown; findings below may reference images not displayed]

FINDINGS: Uterus

Measurements: 13.0 x 8.4 x 9.7 cm = volume: 555.5 mL. Uterus is
anteverted with several fibroids seen as follows;

1. 7.4 x 6.9 x 7.6 cm exophytic fibroid extends from the uterine
fundus.
2. 5.6 x 4.4 x 5.7 cm exophytic fibroid extends from the left
uterine fundus.
3. 4.7 x 4.1 x 4.7 cm intramural fibroid at the left uterine body.

Endometrium

Not visualized, obscured by overlying fibroids.

Right ovary

Measurements: 3.2 x 2.6 x 2.9 cm = volume: 10.1 mL. 1.1 cm
degenerating right ovarian corpus luteal cyst. No other adnexal
mass.

Left ovary

Measurements: 2.3 x 1.5 x 1.8 cm = volume: 3.3 mL. Normal
appearance/no adnexal mass.

Pulsed Doppler evaluation of both ovaries demonstrates normal
low-resistance arterial and venous waveforms.

Other findings

No abnormal free fluid.
IMPRESSION: 1. Enlarged fibroid uterus as detailed above.
2. 1.1 cm degenerating right ovarian corpus luteal cyst. Finding
could contribute to acute pelvic pain. No evidence for ovarian
torsion.
3. Normal left ovary.  No other adnexal mass or free fluid.
4. Nonvisualization of the endometrium, obscured by overlying
fibroids.

## 2022-10-05 DIAGNOSIS — N926 Irregular menstruation, unspecified: Secondary | ICD-10-CM | POA: Diagnosis not present

## 2022-10-05 DIAGNOSIS — K5900 Constipation, unspecified: Secondary | ICD-10-CM | POA: Diagnosis not present

## 2022-10-05 DIAGNOSIS — R630 Anorexia: Secondary | ICD-10-CM | POA: Diagnosis not present

## 2022-10-05 DIAGNOSIS — D259 Leiomyoma of uterus, unspecified: Secondary | ICD-10-CM | POA: Diagnosis not present

## 2022-10-05 DIAGNOSIS — R109 Unspecified abdominal pain: Secondary | ICD-10-CM | POA: Diagnosis not present

## 2022-10-05 DIAGNOSIS — M25511 Pain in right shoulder: Secondary | ICD-10-CM | POA: Diagnosis not present

## 2022-10-05 DIAGNOSIS — R110 Nausea: Secondary | ICD-10-CM | POA: Diagnosis not present

## 2022-10-11 ENCOUNTER — Encounter (HOSPITAL_COMMUNITY): Payer: Self-pay

## 2022-10-11 ENCOUNTER — Emergency Department (HOSPITAL_COMMUNITY)
Admission: EM | Admit: 2022-10-11 | Discharge: 2022-10-11 | Disposition: A | Discharge status: Home / Self Care | Payer: Medicaid Other | Attending: Emergency Medicine | Admitting: Emergency Medicine

## 2022-10-11 ENCOUNTER — Other Ambulatory Visit: Payer: Self-pay

## 2022-10-11 DIAGNOSIS — D509 Iron deficiency anemia, unspecified: Secondary | ICD-10-CM | POA: Insufficient documentation

## 2022-10-11 DIAGNOSIS — R799 Abnormal finding of blood chemistry, unspecified: Secondary | ICD-10-CM | POA: Diagnosis present

## 2022-10-11 MED ORDER — FERROUS SULFATE 325 (65 FE) MG PO TABS: 325.0000 mg | ORAL_TABLET | Freq: Every day | ORAL | 1 refills | Status: DC

## 2022-10-11 NOTE — ED Triage Notes (Signed)
Patient reports she went to urgent care and got lab work on Monday. States they called her yesterday about some abnormal labs and she is concerned about that. States she is anemic and had a rash on her face as well but it is clearing up now. Does not have pcp here, new to area.

## 2022-10-11 NOTE — ED Provider Notes (Signed)
[facility] Provider Note   CSN: [id number] Arrival date & time: 10/11/22  0410     History  Chief Complaint  Patient presents with   Abnormal Labs    Haley Hogan is a 37 y.o. female.  The history is provided by the patient and medical records.   37 y.o F with longstanding hx of anemia, presenting to the ED for review of labs.  She had lab work done on Monday and was told she was anemic and is concerned about that.  She brought labs with her and hemoglobin was 7.2.  She is not currently taking any Fe+ supplements.  She also has history of heavy menses due to fibroids, has not seen GYN in about 3 years though.  She denies dizziness, fatigue, syncope, chest pain, or SOB currently.  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  Home Medications Prior to Admission medications   Medication Sig Start Date End Date Taking? Authorizing Provider  benzonatate (TESSALON) 100 MG capsule Take 1 capsule (100 mg total) by mouth 3 (three) times daily as needed for cough. 04/16/21   Petrucelli, Samantha R, PA-C  metroNIDAZOLE (FLAGYL) 500 MG tablet Take 1 tablet (500 mg total) by mouth 2 (two) times daily. 04/16/21   Petrucelli, Samantha R, PA-C  naproxen (NAPROSYN) 500 MG tablet Take 1 tablet (500 mg total) by mouth 2 (two) times daily as needed for moderate pain. 04/16/21   Petrucelli, Samantha R, PA-C  ondansetron (ZOFRAN ODT) 4 MG disintegrating tablet Take 1 tablet (4 mg total) by mouth every 8 (eight) hours as needed for nausea or vomiting. 04/16/21   Petrucelli, Samantha R, PA-C      Allergies    Bee pollen, Other, and Penicillins    Review of Systems   Review of Systems  Constitutional:        Lab review  All other systems reviewed and are negative.   Physical Exam Updated Vital Signs BP (!) 152/84 (BP Location: Left Arm)   Pulse (!) 101   Temp 97.7 F (36.5 C) (Oral)   Resp 18   Ht 5' 6.5" (1.689  m)   Wt 52.2 kg   SpO2 100%   BMI 18.28 kg/m   Physical Exam Vitals and nursing note reviewed.  Constitutional:      Appearance: She is well-developed.     Comments: Thin but not cachectic  HENT:     Head: Normocephalic and atraumatic.  Eyes:     Conjunctiva/sclera: Conjunctivae normal.     Pupils: Pupils are equal, round, and reactive to light.  Cardiovascular:     Rate and Rhythm: Normal rate and regular rhythm.     Heart sounds: Normal heart sounds.  Pulmonary:     Effort: Pulmonary effort is normal.     Breath sounds: Normal breath sounds.  Abdominal:     General: Bowel sounds are normal.     Palpations: Abdomen is soft.  Musculoskeletal:        General: Normal range of motion.     Cervical back: Normal range of motion.  Skin:    General: Skin is warm and dry.  Neurological:     Mental Status: She is alert and oriented to person, place, and time.     ED Results / Procedures / Treatments   Labs (all labs ordered are listed, but only abnormal results are displayed) Labs Reviewed - No data to display  EKG None  Radiology No results found.  Procedures Procedures    Medications Ordered in ED Medications - No data to display  ED Course/ Medical Decision Making/ A&P                             Medical Decision Making Risk OTC drugs.   37 y.o. F here to review labs from UC.  Longstanding hx of anemia, most recent hemoglobin from labs was 7.2.  Her indices are also quite low, I suspect this is mainly Fe+ deficiency anemia.  Also may have some blood loss anemia due to irregular bleeding from fibroids, currently on her cycle.  Her vitals are stable, no syncope, chest pain, or SOB.  She does not appear to need emergent transfusion.  We will start her on Fe+ supplements, refer to GYN for further evaluation/management of her fibroids.  Encouraged her to increase her oral iron intake as well-- diet choices attached for her.  Also reported to UC she suffers  from anorexia, will be given resource guide for treatment centers should she elect to get help with this.  She can return here for any new/acute changes.  Final Clinical Impression(s) / ED Diagnoses Final diagnoses:  Iron deficiency anemia, unspecified iron deficiency anemia type    Rx / DC Orders ED Discharge Orders     None         Larene Pickett, PA-C 10/11/22 0509    Palumbo, April, MD 10/11/22 [phone]

## 2022-10-11 NOTE — Discharge Instructions (Addendum)
Try to increase your daily iron intake.  We are starting you on a supplement but please try to eat from the diet I attached. I have also attached a list of local primary care offices in the area-- can try one of those or find your own. Information for GYN listed-- can call and make appt for ongoing care of the fibroids. Return here for new concerns.

## 2023-01-20 ENCOUNTER — Emergency Department
Admission: EM | Admit: 2023-01-20 | Discharge: 2023-01-20 | Disposition: A | Discharge status: Home / Self Care | Payer: Medicaid Other | Attending: Emergency Medicine | Admitting: Emergency Medicine

## 2023-01-20 ENCOUNTER — Emergency Department: Payer: Medicaid Other

## 2023-01-20 ENCOUNTER — Other Ambulatory Visit: Payer: Self-pay

## 2023-01-20 ENCOUNTER — Encounter: Payer: Self-pay | Admitting: Emergency Medicine

## 2023-01-20 DIAGNOSIS — M25561 Pain in right knee: Secondary | ICD-10-CM | POA: Diagnosis not present

## 2023-01-20 DIAGNOSIS — M791 Myalgia, unspecified site: Secondary | ICD-10-CM | POA: Diagnosis not present

## 2023-01-20 DIAGNOSIS — Y92410 Unspecified street and highway as the place of occurrence of the external cause: Secondary | ICD-10-CM | POA: Insufficient documentation

## 2023-01-20 DIAGNOSIS — S8001XA Contusion of right knee, initial encounter: Secondary | ICD-10-CM | POA: Insufficient documentation

## 2023-01-20 DIAGNOSIS — M7918 Myalgia, other site: Secondary | ICD-10-CM

## 2023-01-20 DIAGNOSIS — M545 Low back pain, unspecified: Secondary | ICD-10-CM | POA: Diagnosis not present

## 2023-01-20 DIAGNOSIS — R079 Chest pain, unspecified: Secondary | ICD-10-CM | POA: Diagnosis not present

## 2023-01-20 LAB — URINALYSIS, ROUTINE W REFLEX MICROSCOPIC
Bacteria, UA: NONE SEEN
Bilirubin Urine: NEGATIVE
Glucose, UA: NEGATIVE mg/dL
Hgb urine dipstick: NEGATIVE
Ketones, ur: NEGATIVE mg/dL
Nitrite: NEGATIVE
Protein, ur: NEGATIVE mg/dL
Specific Gravity, Urine: 1.017 (ref 1.005–1.030)
pH: 6 (ref 5.0–8.0)

## 2023-01-20 LAB — POC URINE PREG, ED: Preg Test, Ur: NEGATIVE

## 2023-01-20 MED ORDER — CYCLOBENZAPRINE HCL 5 MG PO TABS: 5.0000 mg | ORAL_TABLET | Freq: Three times a day (TID) | ORAL | 0 refills | Status: DC | PRN

## 2023-01-20 MED ORDER — ACETAMINOPHEN 325 MG PO TABS
650.0000 mg | ORAL_TABLET | Freq: Once | ORAL | Status: AC
  Administered 2023-01-20: 650 mg via ORAL
  Filled 2023-01-20: qty 2

## 2023-01-20 MED ORDER — IBUPROFEN 800 MG PO TABS: 800.0000 mg | ORAL_TABLET | Freq: Three times a day (TID) | ORAL | 0 refills | Status: DC | PRN
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
## 2023-02-16 DIAGNOSIS — D219 Benign neoplasm of connective and other soft tissue, unspecified: Secondary | ICD-10-CM | POA: Insufficient documentation

## 2023-02-16 DIAGNOSIS — D500 Iron deficiency anemia secondary to blood loss (chronic): Secondary | ICD-10-CM | POA: Insufficient documentation

## 2023-06-17 ENCOUNTER — Other Ambulatory Visit (HOSPITAL_COMMUNITY)
Admission: RE | Admit: 2023-06-17 | Discharge: 2023-06-17 | Disposition: A | Discharge status: Home / Self Care | Payer: Medicaid Other | Source: Ambulatory Visit | Attending: Obstetrics and Gynecology | Admitting: Obstetrics and Gynecology

## 2023-06-17 ENCOUNTER — Ambulatory Visit: Payer: Medicaid Other | Admitting: Obstetrics and Gynecology

## 2023-06-17 ENCOUNTER — Telehealth: Payer: Self-pay | Admitting: Obstetrics and Gynecology

## 2023-06-17 ENCOUNTER — Encounter: Payer: Self-pay | Admitting: Obstetrics and Gynecology

## 2023-06-17 VITALS — BP 144/77 | HR 94 | Ht 66.5 in | Wt 128.0 lb

## 2023-06-17 DIAGNOSIS — D500 Iron deficiency anemia secondary to blood loss (chronic): Secondary | ICD-10-CM | POA: Diagnosis not present

## 2023-06-17 DIAGNOSIS — Z202 Contact with and (suspected) exposure to infections with a predominantly sexual mode of transmission: Secondary | ICD-10-CM

## 2023-06-17 DIAGNOSIS — N939 Abnormal uterine and vaginal bleeding, unspecified: Secondary | ICD-10-CM

## 2023-06-17 DIAGNOSIS — A599 Trichomoniasis, unspecified: Secondary | ICD-10-CM | POA: Diagnosis not present

## 2023-06-17 DIAGNOSIS — R7303 Prediabetes: Secondary | ICD-10-CM | POA: Diagnosis not present

## 2023-06-17 DIAGNOSIS — R7989 Other specified abnormal findings of blood chemistry: Secondary | ICD-10-CM | POA: Diagnosis not present

## 2023-06-17 DIAGNOSIS — D219 Benign neoplasm of connective and other soft tissue, unspecified: Secondary | ICD-10-CM | POA: Diagnosis not present

## 2023-06-17 DIAGNOSIS — Z862 Personal history of diseases of the blood and blood-forming organs and certain disorders involving the immune mechanism: Secondary | ICD-10-CM

## 2023-06-17 DIAGNOSIS — E875 Hyperkalemia: Secondary | ICD-10-CM

## 2023-06-17 DIAGNOSIS — E538 Deficiency of other specified B group vitamins: Secondary | ICD-10-CM | POA: Diagnosis not present

## 2023-06-17 NOTE — Progress Notes (Signed)
New GYN  HTN not on Rx at this time. Has upcoming appt for PCP   LMP: 05/10/23 cycles are extremely heavy.  Contraception: None  Last pap: 2021 WNL Hx of abnormal Paps and LEEP  STD Screening: Desires   CC: Fibroids. Notes in July/Aug was having irregular prolonged heavy bleeding. Has concerns with having breast tenderness off/ on.

## 2023-06-17 NOTE — Telephone Encounter (Signed)
GYN Telephone Note  Shelda Pal, MD Please call [phone] for a peer to peer review for prior auth for MRI  Number called and MRI authorized through Jan 19th. Approval # [id number]  Through January 19th   Cornelia Copa MD Attending Center for [facility] (Faculty Practice) 06/17/2023 Time: [id number]

## 2023-06-18 LAB — COMPREHENSIVE METABOLIC PANEL
ALT: 17 [IU]/L (ref 0–32)
AST: 29 [IU]/L (ref 0–40)
Albumin: 5 g/dL — ABNORMAL HIGH (ref 3.9–4.9)
Alkaline Phosphatase: 78 [IU]/L (ref 44–121)
BUN/Creatinine Ratio: 14 (ref 9–23)
BUN: 11 mg/dL (ref 6–20)
Bilirubin Total: <0.2 mg/dL (ref 0.0–1.2)
CO2: 21 mmol/L (ref 20–29)
Calcium: 9.9 mg/dL (ref 8.7–10.2)
Chloride: 101 mmol/L (ref 96–106)
Creatinine, Ser: 0.78 mg/dL (ref 0.57–1.00)
Globulin, Total: 3.4 g/dL (ref 1.5–4.5)
Glucose: 63 mg/dL — ABNORMAL LOW (ref 70–99)
Potassium: 5.3 mmol/L — ABNORMAL HIGH (ref 3.5–5.2)
Sodium: 138 mmol/L (ref 134–144)
Total Protein: 8.4 g/dL (ref 6.0–8.5)
eGFR: 99 mL/min/{1.73_m2} (ref >59)

## 2023-06-18 LAB — ANEMIA PROFILE B
Basophils Absolute: 0.1 10*3/uL (ref 0.0–0.2)
Basos: 1 %
EOS (ABSOLUTE): 0.1 10*3/uL (ref 0.0–0.4)
Eos: 2 %
Ferritin: 6 ng/mL — ABNORMAL LOW (ref 15–150)
Folate: 2.4 ng/mL — ABNORMAL LOW (ref >3.0)
Hematocrit: 30 % — ABNORMAL LOW (ref 34.0–46.6)
Hemoglobin: 8.3 g/dL — ABNORMAL LOW (ref 11.1–15.9)
Immature Grans (Abs): 0 10*3/uL (ref 0.0–0.1)
Immature Granulocytes: 0 %
Iron Saturation: 2 % — CL (ref 15–55)
Iron: 11 ug/dL — ABNORMAL LOW (ref 27–159)
Lymphocytes Absolute: 2 10*3/uL (ref 0.7–3.1)
Lymphs: 23 %
MCH: 21.7 pg — ABNORMAL LOW (ref 26.6–33.0)
MCHC: 27.7 g/dL — ABNORMAL LOW (ref 31.5–35.7)
MCV: 79 fL (ref 79–97)
Monocytes Absolute: 0.8 10*3/uL (ref 0.1–0.9)
Monocytes: 9 %
Neutrophils Absolute: 5.7 10*3/uL (ref 1.4–7.0)
Neutrophils: 65 %
Platelets: 774 10*3/uL — ABNORMAL HIGH (ref 150–450)
RBC: 3.82 x10E6/uL (ref 3.77–5.28)
RDW: 20.7 % — ABNORMAL HIGH (ref 11.7–15.4)
Retic Ct Pct: 1.6 % (ref 0.6–2.6)
Total Iron Binding Capacity: 727 ug/dL (ref 250–450)
UIBC: 716 ug/dL — ABNORMAL HIGH (ref 131–425)
Vitamin B-12: 626 pg/mL (ref 232–1245)
WBC: 8.7 10*3/uL (ref 3.4–10.8)

## 2023-06-18 LAB — CERVICOVAGINAL ANCILLARY ONLY
Bacterial Vaginitis (gardnerella): NEGATIVE
Candida Glabrata: NEGATIVE
Candida Vaginitis: POSITIVE — AB
Chlamydia: NEGATIVE
Comment: NEGATIVE
Comment: NEGATIVE
Comment: NEGATIVE
Comment: NEGATIVE
Comment: NEGATIVE
Comment: NORMAL
Neisseria Gonorrhea: NEGATIVE
Trichomonas: POSITIVE — AB

## 2023-06-18 LAB — RPR+HBSAG+HCVAB+...
HIV Screen 4th Generation wRfx: NONREACTIVE
Hep C Virus Ab: NONREACTIVE
Hepatitis B Surface Ag: NEGATIVE
RPR Ser Ql: NONREACTIVE

## 2023-06-18 LAB — HEMOGLOBIN A1C
Est. average glucose Bld gHb Est-mCnc: 117 mg/dL
Hgb A1c MFr Bld: 5.7 % — ABNORMAL HIGH (ref 4.8–5.6)

## 2023-06-18 LAB — TSH RFX ON ABNORMAL TO FREE T4: TSH: 1.01 u[IU]/mL (ref 0.450–4.500)

## 2023-06-18 LAB — VITAMIN D 25 HYDROXY (VIT D DEFICIENCY, FRACTURES): Vit D, 25-Hydroxy: 16.5 ng/mL — ABNORMAL LOW (ref 30.0–100.0)

## 2023-06-21 ENCOUNTER — Encounter: Payer: Self-pay | Admitting: Obstetrics and Gynecology

## 2023-06-21 DIAGNOSIS — D219 Benign neoplasm of connective and other soft tissue, unspecified: Secondary | ICD-10-CM | POA: Insufficient documentation

## 2023-06-21 DIAGNOSIS — D500 Iron deficiency anemia secondary to blood loss (chronic): Secondary | ICD-10-CM | POA: Insufficient documentation

## 2023-06-21 DIAGNOSIS — E875 Hyperkalemia: Secondary | ICD-10-CM | POA: Insufficient documentation

## 2023-06-21 DIAGNOSIS — R7303 Prediabetes: Secondary | ICD-10-CM | POA: Insufficient documentation

## 2023-06-21 DIAGNOSIS — N939 Abnormal uterine and vaginal bleeding, unspecified: Secondary | ICD-10-CM | POA: Insufficient documentation

## 2023-06-21 DIAGNOSIS — R7989 Other specified abnormal findings of blood chemistry: Secondary | ICD-10-CM | POA: Insufficient documentation

## 2023-06-21 DIAGNOSIS — E538 Deficiency of other specified B group vitamins: Secondary | ICD-10-CM | POA: Insufficient documentation

## 2023-06-21 DIAGNOSIS — A599 Trichomoniasis, unspecified: Secondary | ICD-10-CM | POA: Insufficient documentation

## 2023-06-21 HISTORY — PX: ENDOMETRIAL BIOPSY: PRO73

## 2023-06-21 MED ORDER — FLUCONAZOLE 150 MG PO TABS: 150.0000 mg | ORAL_TABLET | Freq: Once | ORAL | 0 refills | Status: AC

## 2023-06-21 MED ORDER — METRONIDAZOLE 500 MG PO TABS: ORAL_TABLET | ORAL | 0 refills | Status: DC

## 2023-06-21 MED ORDER — CHOLECALCIFEROL 50 MCG (2000 UT) PO TBDP: ORAL_TABLET | ORAL | 0 refills | Status: DC

## 2023-06-21 NOTE — Addendum Note (Signed)
Addended by: Cadott Bing on: 06/21/2023 10:40 AM   Modules accepted: Orders, Level of Service

## 2023-06-21 NOTE — Procedures (Signed)
Endometrial Biopsy Procedure Note (Attempt  Pre-operative Diagnosis: AUB. fibroids  Post-operative Diagnosis: same  Procedure Details  Cervical exam performed in the presence of a chaperone Urine pregnancy test was done and result was negative.  The risks (including infection, bleeding, pain, and uterine perforation) and benefits of the procedure were explained to the patient and Written informed consent was obtained.  The patient was placed in the dorsal lithotomy position.  A Graves' speculum inserted in the vagina, and the cervix was very difficult to visualize, very anterior; a pap smear was performed. The cervix was then prepped with povidone iodine, and a sharp tenaculum was applied to the anterior lip of the cervix for stabilization.  A pipelle was inserted but could not go past the internal os on multiple attempts, likely from the distorting fibroids.  No tissue collection attempt was done. The sample was sent for pathologic examination.  Condition: Stable  Complications: None  Plan: The patient was advised to call for any fever or for prolonged or severe pain or bleeding. She was advised to use OTC analgesics as needed for mild to moderate pain. She was advised to avoid vaginal intercourse for 48 hours or until the bleeding has completely stopped.  Cornelia Copa MD Attending Center for [employer] [facility])

## 2023-06-21 NOTE — Progress Notes (Addendum)
Obstetrics and Gynecology New Patient Evaluation  Appointment Date: 06/17/2023  OBGYN Clinic: [facility] for [facility]  Primary Care Provider: None  Referring Provider: Self  Chief Complaint:  Chief Complaint  Patient presents with   Gynecologic Exam    History of Present Illness: Haley Hogan is a 37 y.o.  [id number] (Patient's last menstrual period was 04/30/2023.), seen for the above chief complaint. Her past medical history is significant for fibroids, anemia.    Patient has a multiple year history of significant uterine fibroids with secondary mass effects including difficulty voiding and constipation, in addition to heavy and painful monthly menses. Patient has been anemic at multiple ED visits to Hg of 7 as a consequence but is not currently taking any supplements.  Moved from [place] in 2021 and has full workup including ultrasounds and MRI but has been lost to OBGYN follow up since then.   In March 2024, patient went to [facility] ED for abnormal labs showing the above mentioned low hemoglobin where they just recommended she follow up with a GYN. Only imaging in the system is from 03/2021 where she went to [facility] ED for pelvic pain for a week. See below but u/s showed a 13 x 8.5 x 9.7cm  uterus with multiple 5-7cm exophytic fibroids.   Patient has had several ED visits for STI exposure and endorses 1 sexual partner with routine unprotected sex not currently on any birth control. Patient currently ambivalent regarding pregnancy would not like any birth control at this time.   Review of Systems: Pertinent items noted in HPI and remainder of comprehensive ROS otherwise negative.    Past Medical History:  Past Medical History:  Diagnosis Date   Abnormal Pap smear of cervix    Anemia    Fibroids    Hypertension     Past Surgical History:  Past Surgical History:  Procedure Laterality Date   LEEP     MANDIBLE FRACTURE SURGERY     SHOULDER ARTHROSCOPY      Past  Obstetrical History:  OB History  Gravida Para Term Preterm AB Living  5 1 1   4 1   SAB IAB Ectopic Multiple Live Births  4            # Outcome Date GA Lbr Len/2nd Weight Sex Type Anes PTL Lv  5 Term           4 SAB           3 SAB           2 SAB           1 SAB             Obstetric Comments  Vaginal Birth x 1    Past Gynecological History: As per HPI. Periods: heavy and painful periods, qmonth, can be up to two weeks.   Social History:  Social History   Socioeconomic History   Marital status: Single    Spouse name: Not on file   Number of children: Not on file   Years of education: Not on file   Highest education level: Not on file  Occupational History   Not on file  Tobacco Use   Smoking status: Some Days    Types: Cigarettes, Cigars   Smokeless tobacco: Never  Vaping Use   Vaping status: Never Used  Substance and Sexual Activity   Alcohol use: Yes    Comment: socially   Drug use: Yes    Types:  Marijuana   Sexual activity: Yes    Partners: Male  Other Topics Concern   Not on file  Social History Narrative   Not on file   Social Determinants of Health   Financial Resource Strain: Not on file  Food Insecurity: Not on file  Transportation Needs: Not on file  Physical Activity: Not on file  Stress: Not on file  Social Connections: Unknown (01/05/2023)   Received from [facility], [facility]   Social Network    Social Network: Not on file  Intimate Partner Violence: Unknown (01/05/2023)   Received from [facility], [facility]   HITS    Physically Hurt: Not on file    Insult or Talk Down To: Not on file    Threaten Physical Harm: Not on file    Scream or Curse: Not on file    Family History:  Family History  Problem Relation Age of Onset   Cancer Maternal Grandmother     Medications Haley Hogan had no medications administered during this visit. Current Outpatient Medications  Medication Sig Dispense Refill   cyclobenzaprine  (FLEXERIL) 5 MG tablet Take 1 tablet (5 mg total) by mouth 3 (three) times daily as needed. (Patient not taking: Reported on 06/17/2023) 15 tablet 0   ferrous sulfate 325 (65 FE) MG tablet Take 1 tablet (325 mg total) by mouth daily. (Patient not taking: Reported on 06/17/2023) 30 tablet 1   ibuprofen (ADVIL) 800 MG tablet Take 1 tablet (800 mg total) by mouth every 8 (eight) hours as needed. (Patient not taking: Reported on 06/17/2023) 30 tablet 0   No current facility-administered medications for this visit.    Allergies Penicillins   Physical Exam:  BP (!) 144/77   Pulse 94   Ht 5' 6.5" (1.689 m)   Wt 128 lb (58.1 kg)   LMP 04/30/2023   BMI 20.35 kg/m  Body mass index is 20.35 kg/m. General appearance: Well nourished, well developed female in no acute distress.  Neck:  Supple, normal appearance, and no thyromegaly  Cardiovascular: normal s1 and s2.  No murmurs, rubs or gallops. Respiratory:  Clear to auscultation bilateral. Normal respiratory effort Abdomen: soft, nttp. 14-16 week sized mass to mid to patient's right palpated, mobile Neuro/Psych:  Normal mood and affect.  Skin:  Warm and dry.  Lymphatic:  No inguinal lymphadenopathy.   Cervical exam performed in the presence of a chaperone Pelvic exam: is not limited by body habitus EGBUS: within normal limits Vagina: within normal limits and with no blood or discharge in the vault Cervix: difficult to appreciate, very anterior due to distortion to fibroids Uterus:  14-16 week sized mass to mid to patient's right palpated, mobile and non tender Adnexa:  normal adnexa and no mass, fullness, tenderness Rectovaginal: deferred  Bimanual confirmatoryand multiple likely fibroid masses felt low in the vagina  EMbx attempted but unable to get into uterine cavity likely from distorting fibroids.   Laboratory: as per HPI  Radiology: as per HPI; no new imaging Narrative & Impression  CLINICAL DATA:  Initial evaluation for acute  pelvic pain for 1 week.   EXAM: TRANSABDOMINAL AND TRANSVAGINAL ULTRASOUND OF PELVIS   DOPPLER ULTRASOUND OF OVARIES   TECHNIQUE: Both transabdominal and transvaginal ultrasound examinations of the pelvis were performed. Transabdominal technique was performed for global imaging of the pelvis including uterus, ovaries, adnexal regions, and pelvic cul-de-sac.   It was necessary to proceed with endovaginal exam following the transabdominal exam to visualize the uterus, endometrium,  and ovaries. Color and duplex Doppler ultrasound was utilized to evaluate blood flow to the ovaries.   COMPARISON:  None.   FINDINGS: Uterus   Measurements: 13.0 x 8.4 x 9.7 cm = volume: 555.5 mL. Uterus is anteverted with several fibroids seen as follows;   1. 7.4 x 6.9 x 7.6 cm exophytic fibroid extends from the uterine fundus. 2. 5.6 x 4.4 x 5.7 cm exophytic fibroid extends from the left uterine fundus. 3. 4.7 x 4.1 x 4.7 cm intramural fibroid at the left uterine body.   Endometrium   Not visualized, obscured by overlying fibroids.   Right ovary   Measurements: 3.2 x 2.6 x 2.9 cm = volume: 10.1 mL. 1.1 cm degenerating right ovarian corpus luteal cyst. No other adnexal mass.   Left ovary   Measurements: 2.3 x 1.5 x 1.8 cm = volume: 3.3 mL. Normal appearance/no adnexal mass.   Pulsed Doppler evaluation of both ovaries demonstrates normal low-resistance arterial and venous waveforms.   Other findings   No abnormal free fluid.   IMPRESSION: 1. Enlarged fibroid uterus as detailed above. 2. 1.1 cm degenerating right ovarian corpus luteal cyst. Finding could contribute to acute pelvic pain. No evidence for ovarian torsion. 3. Normal left ovary.  No other adnexal mass or free fluid. 4. Nonvisualization of the endometrium, obscured by overlying fibroids.     Electronically Signed   By: Rise Mu M.D.   On: 04/16/2021 03:51    Assessment: patient stable  Plan:  1.  History of anemia Options d/w her to help with her periods such as PO medications like Lysteda, POPs depo provera, or can even consider Myfembree although her HTN would need to be controlled first; patient has PCP establishing care appointment coming up soon. Patient would like to hold off on any medications in the interim, even though Lysteda may help her to at least keep her blood counts stable. Follow up anemia profile  - RPR+HBsAg+HCVAb+... - TSH Rfx on Abnormal to Free T4 - Anemia Profile B - VITAMIN D 25 Hydroxy (Vit-D Deficiency, Fractures) - Hemoglobin A1c - Comprehensive metabolic panel  2. Fibroids Images reviewed. Patient to sign ROI but she states the MRI she had was several years ago. I toldher that options at this point are likely surgical. If something submucosal is found and she desires to preserve option for child bearing then could try to remove it first. If not, then myomectomy or history are her only options, if she doesn't want to try medical management first. MRI ordered to see about potential options but also because fibroids felt so low, which would help with surgical planning such as whether to even try a diagnostic laparoscopy to see about a TLH and/or need for ureteral stents before surgery.  - RPR+HBsAg+HCVAb+... - TSH Rfx on Abnormal to Free T4 - Anemia Profile B - VITAMIN D 25 Hydroxy (Vit-D Deficiency, Fractures) - Hemoglobin A1c - Comprehensive metabolic panel - MR PELVIS W WO CONTRAST; Future  3. Abnormal uterine bleeding (AUB) - Cervicovaginal ancillary only( [facility]) - Cytology - PAP( [facility]) - RPR+HBsAg+HCVAb+... - TSH Rfx on Abnormal to Free T4 - Anemia Profile B - VITAMIN D 25 Hydroxy (Vit-D Deficiency, Fractures) - Hemoglobin A1c - Comprehensive metabolic panel  4. STD exposure - RPR+HBsAg+HCVAb+...  Orders Placed This Encounter  Procedures   MR PELVIS W WO CONTRAST   RPR+HBsAg+HCVAb+...   TSH Rfx on Abnormal to Free T4   Anemia  Profile B   VITAMIN D 25  Hydroxy (Vit-D Deficiency, Fractures)   Hemoglobin A1c   Comprehensive metabolic panel    Future Appointments  Date Time Provider Department Center  06/30/2023  3:20 PM Salvatore Decent, FNP [facility]  07/01/2023  2:00 PM MC-MR 3 [facility]  07/29/2023 10:55 AM Sebewaing Bing, MD [facility]    Cornelia Copa MD Attending Center for [facility])

## 2023-06-21 NOTE — Addendum Note (Signed)
Addended by: Darden Bing on: 06/21/2023 10:30 AM   Modules accepted: Orders

## 2023-06-22 LAB — CYTOLOGY - PAP
Adequacy: ABSENT
Comment: NEGATIVE
Comment: NEGATIVE
Comment: NEGATIVE
HPV 16: NEGATIVE
HPV 18 / 45: NEGATIVE
High risk HPV: POSITIVE — AB

## 2023-06-25 ENCOUNTER — Encounter: Payer: Self-pay | Admitting: Obstetrics and Gynecology

## 2023-06-25 DIAGNOSIS — N870 Mild cervical dysplasia: Secondary | ICD-10-CM | POA: Insufficient documentation

## 2023-06-25 DIAGNOSIS — R87612 Low grade squamous intraepithelial lesion on cytologic smear of cervix (LGSIL): Secondary | ICD-10-CM | POA: Insufficient documentation

## 2023-06-29 ENCOUNTER — Inpatient Hospital Stay: Payer: Medicaid Other | Attending: Oncology | Admitting: Oncology

## 2023-06-29 ENCOUNTER — Inpatient Hospital Stay: Payer: Medicaid Other

## 2023-06-29 ENCOUNTER — Encounter: Payer: Self-pay | Admitting: Oncology

## 2023-06-29 VITALS — BP 107/61 | HR 77 | Temp 98.2°F | Resp 18

## 2023-06-29 VITALS — BP 136/81 | HR 104 | Temp 97.7°F | Resp 18 | Ht 66.5 in | Wt 127.0 lb

## 2023-06-29 DIAGNOSIS — D500 Iron deficiency anemia secondary to blood loss (chronic): Secondary | ICD-10-CM | POA: Insufficient documentation

## 2023-06-29 DIAGNOSIS — D75839 Thrombocytosis, unspecified: Secondary | ICD-10-CM | POA: Diagnosis not present

## 2023-06-29 DIAGNOSIS — N920 Excessive and frequent menstruation with regular cycle: Secondary | ICD-10-CM | POA: Insufficient documentation

## 2023-06-29 DIAGNOSIS — E538 Deficiency of other specified B group vitamins: Secondary | ICD-10-CM | POA: Diagnosis not present

## 2023-06-29 DIAGNOSIS — R87612 Low grade squamous intraepithelial lesion on cytologic smear of cervix (LGSIL): Secondary | ICD-10-CM | POA: Diagnosis not present

## 2023-06-29 DIAGNOSIS — R87810 Cervical high risk human papillomavirus (HPV) DNA test positive: Secondary | ICD-10-CM | POA: Diagnosis not present

## 2023-06-29 MED ORDER — SODIUM CHLORIDE 0.9 % IV SOLN
510.0000 mg | Freq: Once | INTRAVENOUS | Status: AC
  Administered 2023-06-29: 510 mg via INTRAVENOUS
  Filled 2023-06-29: qty 17

## 2023-06-29 MED ORDER — SODIUM CHLORIDE 0.9 % IV SOLN: Freq: Once | INTRAVENOUS | Status: AC

## 2023-06-29 MED ORDER — FOLIC ACID 1 MG PO TABS: 1.0000 mg | ORAL_TABLET | Freq: Every day | ORAL | 3 refills | Status: DC

## 2023-06-29 MED ORDER — DIPHENHYDRAMINE HCL 50 MG/ML IJ SOLN
25.0000 mg | Freq: Once | INTRAMUSCULAR | Status: AC
  Administered 2023-06-29: 25 mg via INTRAVENOUS
  Filled 2023-06-29: qty 1

## 2023-06-29 MED ORDER — ACETAMINOPHEN 325 MG PO TABS
650.0000 mg | ORAL_TABLET | Freq: Once | ORAL | Status: AC
  Administered 2023-06-29: 650 mg via ORAL
  Filled 2023-06-29: qty 2

## 2023-06-29 MED ORDER — SODIUM CHLORIDE 0.9% FLUSH: 10.0000 mL | Freq: Two times a day (BID) | INTRAVENOUS | Status: DC

## 2023-06-29 NOTE — Progress Notes (Signed)
[facility]  HEMATOLOGY CLINIC CONSULTATION NOTE    PATIENT NAME: Haley Hogan   MR#: [id number] DOB: Oct 28, 1985  DATE OF SERVICE: 06/29/2023  Patient Care Team: Salvatore Decent, FNP as PCP - General (Internal Medicine)  Bing, MD as Consulting Physician (Obstetrics and Gynecology)  REASON FOR CONSULTATION/ CHIEF COMPLAINT:  Evaluation of anemia and thrombocytosis  HISTORY OF PRESENT ILLNESS:  Haley Hogan is a 37 y.o. 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    Discussed the use of AI scribe software for clinical note transcription with the patient, who gave verbal consent to proceed.   She established care with OB/GYN physician Dr. Vergie Living on 06/17/2023.  She has a history of uterine fibroids with secondary mass effects including difficulty voiding and constipation, in addition to heavy and painful monthly menses. Patient has been anemic at multiple ED visits with hemoglobin as low as 7 as a consequence of heavy menstrual bleeding but is not currently taking any supplements. Moved from [place] in 2021 and has full workup including ultrasounds and MRI but has been lost to OBGYN follow up since then.   On 06/17/2023, labs showed hemoglobin of 8.3, hematocrit 30, MCV 79.  White count was 8700 with normal differential, platelet count 774,000.  Ferritin low at 6, iron saturation decreased to 2%, iron binding capacity increased at 727, iron decreased at 11, all indicated of iron deficiency state.  Folic acid was also decreased at 2.4 ng/mL.  Hepatitis, HIV testing, RPR came back negative.  TSH was within normal limits.  Vitamin D low at 16.5 ng/mL.  Testing was positive for trichomonas and candida vaginitis.  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  She has been prescribed iron supplements but has stopped taking them due to constipation. She also has a history of low vitamin D levels. She has a craving for ice, which can be a symptom of iron deficiency anemia. She has not been experiencing chest pain or shortness of breath. She is concerned about a family history of cancer.  She denies recent chest pain on exertion, shortness of breath on minimal exertion, pre-syncopal episodes, or palpitations.  She had not noticed any recent bleeding such as epistaxis, hematuria or hematochezia  She had no prior history or diagnosis of cancer.   MEDICAL HISTORY:  Past Medical History:  Diagnosis Date   Abnormal Pap smear of cervix    Anemia    Fibroids    Hypertension     SURGICAL HISTORY: Past Surgical History:  Procedure Laterality Date   ENDOMETRIAL BIOPSY  06/21/2023   LEEP     MANDIBLE FRACTURE SURGERY     SHOULDER ARTHROSCOPY      SOCIAL HISTORY: She reports that she has been smoking cigarettes and cigars. She has never used smokeless tobacco. She reports current alcohol use. She reports current drug use. Drug: Marijuana. Social History   Socioeconomic History   Marital status: Single    Spouse name: Not on file   Number of children: Not on file   Years of education: Not on file   Highest education level: Not on file  Occupational History   Not on file  Tobacco Use   Smoking status: Some Days    Types: Cigarettes, Cigars   Smokeless tobacco: Never  Vaping Use  Vaping status: Never Used  Substance and Sexual Activity   Alcohol use: Yes    Comment: socially   Drug use: Yes    Types: Marijuana   Sexual activity: Yes    Partners: Male  Other Topics Concern   Not on file  Social History Narrative   Not on file   Social Determinants of Health   Financial Resource Strain: Not on file  Food Insecurity: No Food Insecurity (06/29/2023)   Hunger Vital Sign    Worried About Running Out of Food in the Last Year:  Never true    Ran Out of Food in the Last Year: Never true  Transportation Needs: No Transportation Needs (06/29/2023)   PRAPARE - Administrator, Civil Service (Medical): No    Lack of Transportation (Non-Medical): No  Physical Activity: Not on file  Stress: Not on file  Social Connections: Unknown (01/05/2023)   Received from [facility], [facility]   Social Network    Social Network: Not on file  Intimate Partner Violence: Not At Risk (06/29/2023)   Humiliation, Afraid, Rape, and Kick questionnaire    Fear of Current or Ex-Partner: No    Emotionally Abused: No    Physically Abused: No    Sexually Abused: No    FAMILY HISTORY: Family History  Problem Relation Age of Onset   Cancer Maternal Grandmother     ALLERGIES:  She is allergic to penicillins.  MEDICATIONS:  Current Outpatient Medications  Medication Sig Dispense Refill   folic acid (FOLVITE) 1 MG tablet Take 1 tablet (1 mg total) by mouth daily. 90 tablet 3   Cholecalciferol 50 MCG (2000 UT) TBDP One tablet daily. (Patient not taking: Reported on 06/29/2023) 90 tablet 0   cyclobenzaprine (FLEXERIL) 5 MG tablet Take 1 tablet (5 mg total) by mouth 3 (three) times daily as needed. (Patient not taking: Reported on 06/17/2023) 15 tablet 0   ferrous sulfate 325 (65 FE) MG tablet Take 1 tablet (325 mg total) by mouth daily. (Patient not taking: Reported on 06/17/2023) 30 tablet 1   ibuprofen (ADVIL) 800 MG tablet Take 1 tablet (800 mg total) by mouth every 8 (eight) hours as needed. (Patient not taking: Reported on 06/17/2023) 30 tablet 0   metroNIDAZOLE (FLAGYL) 500 MG tablet Take two tablets by mouth twice a day, for one day.  Or you can take all four tablets at once if you can tolerate it. (Patient not taking: Reported on 06/29/2023) 4 tablet 0   No current facility-administered medications for this visit.   Facility-Administered Medications Ordered in Other Visits  Medication Dose Route Frequency Provider Last  Rate Last Admin   sodium chloride flush (NS) 0.9 % injection 10 mL  10 mL Intravenous Q12H Niva Murren, MD        REVIEW OF SYSTEMS:    Review of Systems - Oncology  All other pertinent systems were reviewed and were negative except as mentioned above.  PHYSICAL EXAMINATION:  ECOG PERFORMANCE STATUS: 1 - Symptomatic but completely ambulatory  Vitals:   06/29/23 0923  BP: 136/81  Pulse: (!) 104  Resp: 18  Temp: 97.7 F (36.5 C)  SpO2: 100%   Filed Weights   06/29/23 0923  Weight: 127 lb (57.6 kg)    Physical Exam Constitutional:      General: She is not in acute distress.    Appearance: Normal appearance.  HENT:     Head: Normocephalic and atraumatic.  Eyes:     General:  No scleral icterus.    Conjunctiva/sclera: Conjunctivae normal.  Cardiovascular:     Rate and Rhythm: Normal rate and regular rhythm.     Heart sounds: Normal heart sounds.  Pulmonary:     Effort: Pulmonary effort is normal.     Breath sounds: Normal breath sounds.  Abdominal:     General: There is no distension.  Musculoskeletal:     Right lower leg: No edema.     Left lower leg: No edema.  Neurological:     General: No focal deficit present.     Mental Status: She is alert and oriented to person, place, and time.  Psychiatric:        Mood and Affect: Mood normal.        Behavior: Behavior normal.        Thought Content: Thought content normal.     LABORATORY DATA:   I have reviewed the data as listed.  Lab Results  Component Value Date   WBC 8.7 06/17/2023   NEUTROABS 5.7 06/17/2023   HGB 8.3 (L) 06/17/2023   HCT 30.0 (L) 06/17/2023   MCV 79 06/17/2023   PLT 774 (H) 06/17/2023    Lab Results  Component Value Date   IRON 11 (L) 06/17/2023   TIBC 727 (HH) 06/17/2023   FERRITIN 6 (L) 06/17/2023    Recent Labs    06/17/23 0953  NA 138  K 5.3*  CL 101  CO2 21  GLUCOSE 63*  BUN 11  CREATININE 0.78  CALCIUM 9.9  PROT 8.4  ALBUMIN 5.0*  AST 29  ALT 17  ALKPHOS  78  BILITOT <0.2     RADIOGRAPHIC STUDIES:  No pertinent imaging studies available to review.   ASSESSMENT & PLAN:   37 y.o. 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    Iron deficiency anemia due to chronic blood loss - Presented with anemia with hemoglobin of 8.3, likely secondary to heavy menstrual bleeding from fibroids and low iron stores. Patient has symptoms of pica (ice craving).  -Recent iron labs at her OB/GYN office from 06/17/2023 reviewed.  Ferritin 6, iron saturation decreased at 2%, iron decreased at 11, iron binding capacity increased at 727.  Iron labs are indicative of severe iron deficiency.  -Since she could not tolerate oral iron, we will proceed with IV iron to replenish her stores and help improve her anemia.  Today we proceeded with 1 dose of Feraheme 510 mg IV.  Another dose will be repeated in 1 week.  -Birth control measures were offered by her OB/GYN to help with her heavy menstrual cycles but patient is contemplating pregnancy and hence deferred this for now.  -Plan to reevaluate her in 3 months with repeat labs including iron studies.  Folic acid deficiency Low levels of vitamin D and folic acid noted. -Sent prescription for folic acid 1 mg daily. -Advised patient to consider over-the-counter multivitamin containing both Vitamin D and B vitamins, depending on cost-effectiveness.   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 Vergie Living.   Since the cause of anemia seems to be obvious from iron deficiency, I am not pursuing extensive workup at this time.  If inadequate response to IV iron is noted, we will pursue workup to rule  out other etiologies.  Orders Placed This Encounter  Procedures    CBC with Differential/Platelet    Standing Status:   Future    Standing Expiration Date:   06/28/2024   Iron and Iron Binding Capacity ([facility] only)    Standing Status:   Future    Standing Expiration Date:   06/28/2024   Ferritin    Standing Status:   Future    Standing Expiration Date:   06/28/2024   Folate    Standing Status:   Future    Standing Expiration Date:   06/28/2024   Vitamin B12    Standing Status:   Future    Standing Expiration Date:   06/28/2024     The total time spent in the appointment was 55 minutes encounter with patients including review of chart and various tests results, discussions about plan of care and coordination of care plan.  I reviewed lab results and outside records for this visit and discussed relevant results with the patient. Diagnosis, plan of care and treatment options were also discussed in detail with the patient. Opportunity provided to ask questions and answers provided to her apparent satisfaction. Provided instructions to call our clinic with any problems, questions or concerns prior to return visit. I recommended to continue follow-up with PCP and sub-specialists. She verbalized understanding and agreed with the plan. No barriers to learning was detected.   Future Appointments  Date Time Provider Department Center  06/30/2023  3:20 PM Salvatore Decent, FNP [facility]  07/01/2023  2:00 PM MC-MR 3 [facility]  07/06/2023 12:15 PM [facility] INFUSION [facility] None  07/29/2023 10:55 AM Garden Bing, MD [facility]  09/27/2023 11:00 AM [facility] PHLEBOTOMIST [facility] None  09/27/2023 11:20 AM Rayshad Riviello, Archie Patten, MD [facility] None     Meryl Crutch, MD [facility] - A DEPT OF [facility]igh[facility] [address]  [place] [address] Dept: [phone] Dept Fax: [phone]  06/29/2023 11:35 AM   This document was completed utilizing speech recognition software. Grammatical  errors, random word insertions, pronoun errors, and incomplete sentences are an occasional consequence of this system due to software limitations, ambient noise, and hardware issues. Any formal questions or concerns about the content, text or information contained within the body of this dictation should be directly addressed to the provider for clarification.

## 2023-06-29 NOTE — Patient Instructions (Signed)
 Ferumoxytol Injection What is this medication? FERUMOXYTOL (FER ue MOX i tol) treats low levels of iron in your body (iron deficiency anemia). Iron is a mineral that plays an important role in making red blood cells, which carry oxygen from your lungs to the rest of your body. This medicine may be used for other purposes; ask your health care provider or pharmacist if you have questions. COMMON BRAND NAME(S): Feraheme What should I tell my care team before I take this medication? They need to know if you have any of these conditions: Anemia not caused by low iron levels High levels of iron in the blood Magnetic resonance imaging (MRI) test scheduled An unusual or allergic reaction to iron, other medications, foods, dyes, or preservatives Pregnant or trying to get pregnant Breastfeeding How should I use this medication? This medication is injected into a vein. It is given by your care team in a hospital or clinic setting. Talk to your care team the use of this medication in children. Special care may be needed. Overdosage: If you think you have taken too much of this medicine contact a poison control center or emergency room at once. NOTE: This medicine is only for you. Do not share this medicine with others. What if I miss a dose? It is important not to miss your dose. Call your care team if you are unable to keep an appointment. What may interact with this medication? Other iron products This list may not describe all possible interactions. Give your health care provider a list of all the medicines, herbs, non-prescription drugs, or dietary supplements you use. Also tell them if you smoke, drink alcohol, or use illegal drugs. Some items may interact with your medicine. What should I watch for while using this medication? Visit your care team regularly. Tell your care team if your symptoms do not start to get better or if they get worse. You may need blood work done while you are taking this  medication. You may need to follow a special diet. Talk to your care team. Foods that contain iron include: whole grains/cereals, dried fruits, beans, or peas, leafy green vegetables, and organ meats (liver, kidney). What side effects may I notice from receiving this medication? Side effects that you should report to your care team as soon as possible: Allergic reactions--skin rash, itching, hives, swelling of the face, lips, tongue, or throat Low blood pressure--dizziness, feeling faint or lightheaded, blurry vision Shortness of breath Side effects that usually do not require medical attention (report to your care team if they continue or are bothersome): Flushing Headache Joint pain Muscle pain Nausea Pain, redness, or irritation at injection site This list may not describe all possible side effects. Call your doctor for medical advice about side effects. You may report side effects to FDA at 1-800-FDA-1088. Where should I keep my medication? This medication is given in a hospital or clinic. It will not be stored at home. NOTE: This sheet is a summary. It may not cover all possible information. If you have questions about this medicine, talk to your doctor, pharmacist, or health care provider.  2024 Elsevier/Gold Standard (2022-12-18 00:00:00)

## 2023-06-29 NOTE — Assessment & Plan Note (Signed)
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 Vergie Living.

## 2023-06-29 NOTE — Assessment & Plan Note (Signed)
Low levels of vitamin D and folic acid noted. -Sent prescription for folic acid 1 mg daily. -Advised patient to consider over-the-counter multivitamin containing both Vitamin D and B vitamins, depending on cost-effectiveness.

## 2023-06-29 NOTE — Assessment & Plan Note (Signed)
-   Presented with anemia with hemoglobin of 8.3, likely secondary to heavy menstrual bleeding from fibroids and low iron stores. Patient has symptoms of pica (ice craving).  -Recent iron labs at her OB/GYN office from 06/17/2023 reviewed.  Ferritin 6, iron saturation decreased at 2%, iron decreased at 11, iron binding capacity increased at 727.  Iron labs are indicative of severe iron deficiency.  -Since she could not tolerate oral iron, we will proceed with IV iron to replenish her stores and help improve her anemia.  Today we proceeded with 1 dose of Feraheme 510 mg IV.  Another dose will be repeated in 1 week.  -Birth control measures were offered by her OB/GYN to help with her heavy menstrual cycles but patient is contemplating pregnancy and hence deferred this for now.  -Plan to reevaluate her in 3 months with repeat labs including iron studies.

## 2023-06-30 ENCOUNTER — Encounter: Payer: Self-pay | Admitting: Internal Medicine

## 2023-06-30 ENCOUNTER — Ambulatory Visit: Payer: Medicaid Other | Admitting: Internal Medicine

## 2023-06-30 VITALS — BP 134/88 | HR 80 | Temp 97.9°F | Ht 66.5 in | Wt 126.6 lb

## 2023-06-30 DIAGNOSIS — R630 Anorexia: Secondary | ICD-10-CM

## 2023-06-30 DIAGNOSIS — E559 Vitamin D deficiency, unspecified: Secondary | ICD-10-CM | POA: Diagnosis not present

## 2023-06-30 DIAGNOSIS — L700 Acne vulgaris: Secondary | ICD-10-CM

## 2023-06-30 DIAGNOSIS — E875 Hyperkalemia: Secondary | ICD-10-CM

## 2023-06-30 DIAGNOSIS — D219 Benign neoplasm of connective and other soft tissue, unspecified: Secondary | ICD-10-CM

## 2023-06-30 DIAGNOSIS — D500 Iron deficiency anemia secondary to blood loss (chronic): Secondary | ICD-10-CM | POA: Diagnosis not present

## 2023-06-30 DIAGNOSIS — R7303 Prediabetes: Secondary | ICD-10-CM

## 2023-06-30 DIAGNOSIS — R87612 Low grade squamous intraepithelial lesion on cytologic smear of cervix (LGSIL): Secondary | ICD-10-CM | POA: Diagnosis not present

## 2023-06-30 MED ORDER — CYPROHEPTADINE HCL 4 MG PO TABS: 4.0000 mg | ORAL_TABLET | Freq: Two times a day (BID) | ORAL | 1 refills | Status: AC

## 2023-06-30 MED ORDER — ADAPALENE 0.1 % EX CREA: TOPICAL_CREAM | Freq: Every day | CUTANEOUS | 0 refills | Status: DC
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
## 2023-07-01 ENCOUNTER — Ambulatory Visit (HOSPITAL_COMMUNITY)
Admission: RE | Admit: 2023-07-01 | Discharge: 2023-07-01 | Disposition: A | Discharge status: Home / Self Care | Payer: Medicaid Other | Source: Ambulatory Visit | Attending: Obstetrics and Gynecology | Admitting: Obstetrics and Gynecology

## 2023-07-01 DIAGNOSIS — D219 Benign neoplasm of connective and other soft tissue, unspecified: Secondary | ICD-10-CM | POA: Insufficient documentation

## 2023-07-01 DIAGNOSIS — N921 Excessive and frequent menstruation with irregular cycle: Secondary | ICD-10-CM | POA: Diagnosis not present

## 2023-07-01 DIAGNOSIS — K5900 Constipation, unspecified: Secondary | ICD-10-CM | POA: Diagnosis not present

## 2023-07-01 DIAGNOSIS — N852 Hypertrophy of uterus: Secondary | ICD-10-CM | POA: Diagnosis not present

## 2023-07-01 DIAGNOSIS — D259 Leiomyoma of uterus, unspecified: Secondary | ICD-10-CM | POA: Diagnosis not present

## 2023-07-01 LAB — BASIC METABOLIC PANEL
BUN: 11 mg/dL (ref 6–23)
CO2: 25 meq/L (ref 19–32)
Calcium: 9.5 mg/dL (ref 8.4–10.5)
Chloride: 101 meq/L (ref 96–112)
Creatinine, Ser: 0.69 mg/dL (ref 0.40–1.20)
GFR: 109.29 mL/min (ref >60.00)
Glucose, Bld: 79 mg/dL (ref 70–99)
Potassium: 3.5 meq/L (ref 3.5–5.1)
Sodium: 137 meq/L (ref 135–145)

## 2023-07-01 MED ORDER — GADOBUTROL 1 MMOL/ML IV SOLN
6.0000 mL | Freq: Once | INTRAVENOUS | Status: AC | PRN
  Administered 2023-07-01: 6 mL via INTRAVENOUS

## 2023-07-02 ENCOUNTER — Encounter: Payer: PRIVATE HEALTH INSURANCE | Admitting: Obstetrics and Gynecology

## 2023-07-06 ENCOUNTER — Inpatient Hospital Stay: Payer: Medicaid Other

## 2023-07-06 VITALS — BP 113/69 | HR 77 | Temp 98.1°F | Resp 16

## 2023-07-06 DIAGNOSIS — E538 Deficiency of other specified B group vitamins: Secondary | ICD-10-CM | POA: Diagnosis not present

## 2023-07-06 DIAGNOSIS — R87612 Low grade squamous intraepithelial lesion on cytologic smear of cervix (LGSIL): Secondary | ICD-10-CM | POA: Diagnosis not present

## 2023-07-06 DIAGNOSIS — N920 Excessive and frequent menstruation with regular cycle: Secondary | ICD-10-CM | POA: Diagnosis not present

## 2023-07-06 DIAGNOSIS — D500 Iron deficiency anemia secondary to blood loss (chronic): Secondary | ICD-10-CM | POA: Diagnosis not present

## 2023-07-06 DIAGNOSIS — D75839 Thrombocytosis, unspecified: Secondary | ICD-10-CM | POA: Diagnosis not present

## 2023-07-06 DIAGNOSIS — R87810 Cervical high risk human papillomavirus (HPV) DNA test positive: Secondary | ICD-10-CM | POA: Diagnosis not present

## 2023-07-06 MED ORDER — SODIUM CHLORIDE 0.9 % IV SOLN
510.0000 mg | Freq: Once | INTRAVENOUS | Status: AC
  Administered 2023-07-06: 510 mg via INTRAVENOUS
  Filled 2023-07-06: qty 510

## 2023-07-06 MED ORDER — SODIUM CHLORIDE 0.9 % IV SOLN: INTRAVENOUS | Status: DC

## 2023-07-06 MED ORDER — DIPHENHYDRAMINE HCL 50 MG/ML IJ SOLN
25.0000 mg | Freq: Once | INTRAMUSCULAR | Status: AC
  Administered 2023-07-06: 25 mg via INTRAVENOUS
  Filled 2023-07-06: qty 1

## 2023-07-06 MED ORDER — SODIUM CHLORIDE 0.9% FLUSH: 10.0000 mL | Freq: Two times a day (BID) | INTRAVENOUS | Status: DC

## 2023-07-06 MED ORDER — ACETAMINOPHEN 325 MG PO TABS
650.0000 mg | ORAL_TABLET | Freq: Once | ORAL | Status: AC
Start: 2023-07-06 — End: 2023-07-06
  Administered 2023-07-06: 650 mg via ORAL
  Filled 2023-07-06: qty 2

## 2023-07-06 NOTE — Patient Instructions (Addendum)
Ferumoxytol Injection What is this medication? FERUMOXYTOL (FER ue MOX i tol) treats low levels of iron in your body (iron deficiency anemia). Iron is a mineral that plays an important role in making red blood cells, which carry oxygen from your lungs to the rest of your body. This medicine may be used for other purposes; ask your health care provider or pharmacist if you have questions. COMMON BRAND NAME(S): Feraheme What should I tell my care team before I take this medication? They need to know if you have any of these conditions: Anemia not caused by low iron levels High levels of iron in the blood Magnetic resonance imaging (MRI) test scheduled An unusual or allergic reaction to iron, other medications, foods, dyes, or preservatives Pregnant or trying to get pregnant Breastfeeding How should I use this medication? This medication is injected into a vein. It is given by your care team in a hospital or clinic setting. Talk to your care team the use of this medication in children. Special care may be needed. Overdosage: If you think you have taken too much of this medicine contact a poison control center or emergency room at once. NOTE: This medicine is only for you. Do not share this medicine with others. What if I miss a dose? It is important not to miss your dose. Call your care team if you are unable to keep an appointment. What may interact with this medication? Other iron products This list may not describe all possible interactions. Give your health care provider a list of all the medicines, herbs, non-prescription drugs, or dietary supplements you use. Also tell them if you smoke, drink alcohol, or use illegal drugs. Some items may interact with your medicine. What should I watch for while using this medication? Visit your care team regularly. Tell your care team if your symptoms do not start to get better or if they get worse. You may need blood work done while you are taking this  medication. You may need to follow a special diet. Talk to your care team. Foods that contain iron include: whole grains/cereals, dried fruits, beans, or peas, leafy green vegetables, and organ meats (liver, kidney). What side effects may I notice from receiving this medication? Side effects that you should report to your care team as soon as possible: Allergic reactions--skin rash, itching, hives, swelling of the face, lips, tongue, or throat Low blood pressure--dizziness, feeling faint or lightheaded, blurry vision Shortness of breath Side effects that usually do not require medical attention (report to your care team if they continue or are bothersome): Flushing Headache Joint pain Muscle pain Nausea Pain, redness, or irritation at injection site This list may not describe all possible side effects. Call your doctor for medical advice about side effects. You may report side effects to FDA at 1-800-FDA-1088. Where should I keep my medication? This medication is given in a hospital or clinic. It will not be stored at home. NOTE: This sheet is a summary. It may not cover all possible information. If you have questions about this medicine, talk to your doctor, pharmacist, or health care provider.  2024 Elsevier/Gold Standard (2022-12-18 00:00:00) \[id number]\

## 2023-07-08 ENCOUNTER — Telehealth: Payer: Self-pay | Admitting: Obstetrics and Gynecology

## 2023-07-08 DIAGNOSIS — D259 Leiomyoma of uterus, unspecified: Secondary | ICD-10-CM

## 2023-07-08 NOTE — Telephone Encounter (Signed)
GYN Telephone Note Patient called [phone] and MRI results d/w her, which shows that fibroids take up entire pelvis/sacral curve. She confirms that her s/s are primarily bulk related such as pain and pressure. Given this, I told her that a hysterectomy is really her only options with myomectomy previously d/w her if she is done with child bearing. I told her a hysterectomy would be a big surgery due to size, likely need for vertical midline incision, injury to surround structures particularly bowel, bladder, ureters, as well need for several day hospitalization and help at home for the first two weeks, at least and need to be out of work for at least 6 weeks.   Patient would like to move forward with a hysterectomy sometime after January 21st. Will see pt back for colpo in early January  Request sent to DB for scheduling  Cornelia Copa MD Attending Center for [facility] (Faculty Practice) 07/08/2023 Time: [phone]

## 2023-07-29 ENCOUNTER — Ambulatory Visit: Payer: Medicaid Other | Admitting: Obstetrics and Gynecology

## 2023-07-29 ENCOUNTER — Other Ambulatory Visit (HOSPITAL_COMMUNITY)
Admission: RE | Admit: 2023-07-29 | Discharge: 2023-07-29 | Disposition: A | Discharge status: Home / Self Care | Payer: Medicaid Other | Source: Ambulatory Visit | Attending: Obstetrics and Gynecology | Admitting: Obstetrics and Gynecology

## 2023-07-29 ENCOUNTER — Encounter: Payer: Self-pay | Admitting: Obstetrics and Gynecology

## 2023-07-29 ENCOUNTER — Telehealth: Payer: Self-pay

## 2023-07-29 VITALS — BP 140/75 | HR 86 | Wt 126.4 lb

## 2023-07-29 DIAGNOSIS — R87612 Low grade squamous intraepithelial lesion on cytologic smear of cervix (LGSIL): Secondary | ICD-10-CM | POA: Insufficient documentation

## 2023-07-29 DIAGNOSIS — N898 Other specified noninflammatory disorders of vagina: Secondary | ICD-10-CM

## 2023-07-29 DIAGNOSIS — R87810 Cervical high risk human papillomavirus (HPV) DNA test positive: Secondary | ICD-10-CM

## 2023-07-29 DIAGNOSIS — N870 Mild cervical dysplasia: Secondary | ICD-10-CM | POA: Diagnosis not present

## 2023-07-29 HISTORY — PX: COLPOSCOPY W/ BIOPSY / CURETTAGE: SUR283

## 2023-07-29 LAB — CERVICOVAGINAL ANCILLARY ONLY
Bacterial Vaginitis (gardnerella): NEGATIVE
Candida Glabrata: NEGATIVE
Candida Vaginitis: NEGATIVE
Chlamydia: NEGATIVE
Comment: NEGATIVE
Comment: NEGATIVE
Comment: NEGATIVE
Comment: NEGATIVE
Comment: NEGATIVE
Comment: NORMAL
Neisseria Gonorrhea: NEGATIVE
Trichomonas: NEGATIVE

## 2023-07-29 NOTE — Progress Notes (Signed)
 CC: colposcopy  Vaginal discharge x several days/ weeks   No sex since 11.16-11.19

## 2023-07-29 NOTE — Telephone Encounter (Signed)
 Called patient to see if she was able to move surgery to 08/09/22 w/ Dr. Debroah Loop? Patient will call me back after her visit w/ Dr. Vergie Living on today. May have a conflict of interest in Jan.

## 2023-07-29 NOTE — Procedures (Signed)
 Colposcopy Procedure Note  Pre-operative Diagnosis: 06/17/2023 pap: LSIL, HPV+, 16/18/45 negative  Post-operative Diagnosis: CIN 1  Procedure Details  Urine pregnancy test: negative. Cervical exam performed in the presence of a chaperone The risks (including infection, bleeding, pain) and benefits of the procedure were explained to the patient and written informed consent was obtained.  The patient was placed in the dorsal lithotomy position. Uterus 16-18 week sized. A Grave was speculum inserted in the vagina and immediately hit fibroids. I had to go underneath and up to get a view of the cervix which was very difficult. Acetic acid applied and TZ not seen but no lesions noted. 12 o'clock biopsy and ECC then done with monsel's applied afterwards  Condition: Stable  Complications: None  Plan: The patient was advised to call for any fever or for prolonged or severe pain or bleeding. She was advised to use OTC analgesics as needed for mild to moderate pain. She was advised to avoid vaginal intercourse for 48 hours or until the bleeding has completely stopped.  Patient also wondering if surgery could be done laparoscopically. I told her I had her MRI reviewed by [facility] and she also feels that TAH is best approach. I also d/w her again re: [place] and that, if she's a candidate can help with bleeding but not best for bulk s/s.   2/12 OR date mis-scheduled as I'm in the office that day. Will need to reschedule OR date.   Bebe Izell Raddle MD Attending Center for [employer] [facility])

## 2023-07-30 LAB — SURGICAL PATHOLOGY

## 2023-08-13 ENCOUNTER — Encounter: Payer: Self-pay | Admitting: Obstetrics and Gynecology

## 2023-08-24 ENCOUNTER — Ambulatory Visit: Payer: Medicaid Other | Admitting: Obstetrics and Gynecology

## 2023-09-06 SURGERY — HYSTERECTOMY ABDOMINAL WITH SALPINGECTOMY
Anesthesia: Choice

## 2023-09-08 ENCOUNTER — Ambulatory Visit (HOSPITAL_COMMUNITY): Admit: 2023-09-08 | Payer: No Typology Code available for payment source | Admitting: Obstetrics & Gynecology

## 2023-09-08 SURGERY — HYSTERECTOMY ABDOMINAL WITH SALPINGECTOMY
Anesthesia: Choice

## 2023-09-10 NOTE — Progress Notes (Signed)
Spoke with pt and found out pt needs to reschedule her procedure until she can find care for herself after there surgery. I called the surgeon's office and left msg for [place] about pt needing to reschedule. Awaiting call back.

## 2023-09-10 NOTE — Progress Notes (Signed)
Office is closed today. I called Dr. Vergie Living to make sure he knew the pt needed to reschedule. He is aware and will have the scheduler call the pt next week to reschedule. Called OR and made Tish aware.

## 2023-09-13 ENCOUNTER — Ambulatory Visit (HOSPITAL_COMMUNITY)
Admission: RE | Admit: 2023-09-13 | Payer: Medicaid Other | Source: Home / Self Care | Admitting: Obstetrics and Gynecology

## 2023-09-13 SURGERY — HYSTERECTOMY, TOTAL, ABDOMINAL, WITH SALPINGECTOMY
Anesthesia: Choice

## 2023-09-27 ENCOUNTER — Encounter: Payer: Self-pay | Admitting: Oncology

## 2023-09-27 ENCOUNTER — Inpatient Hospital Stay: Payer: Medicaid Other | Attending: Oncology

## 2023-09-27 ENCOUNTER — Inpatient Hospital Stay: Payer: Medicaid Other | Admitting: Oncology

## 2023-09-27 VITALS — BP 134/77 | HR 93 | Temp 98.2°F | Resp 18 | Ht 66.5 in | Wt 134.3 lb

## 2023-09-27 DIAGNOSIS — D500 Iron deficiency anemia secondary to blood loss (chronic): Secondary | ICD-10-CM

## 2023-09-27 DIAGNOSIS — E538 Deficiency of other specified B group vitamins: Secondary | ICD-10-CM | POA: Diagnosis not present

## 2023-09-27 DIAGNOSIS — N870 Mild cervical dysplasia: Secondary | ICD-10-CM

## 2023-09-27 DIAGNOSIS — D75839 Thrombocytosis, unspecified: Secondary | ICD-10-CM | POA: Insufficient documentation

## 2023-09-27 DIAGNOSIS — N879 Dysplasia of cervix uteri, unspecified: Secondary | ICD-10-CM | POA: Diagnosis not present

## 2023-09-27 DIAGNOSIS — N920 Excessive and frequent menstruation with regular cycle: Secondary | ICD-10-CM | POA: Diagnosis not present

## 2023-09-27 DIAGNOSIS — D259 Leiomyoma of uterus, unspecified: Secondary | ICD-10-CM | POA: Insufficient documentation

## 2023-09-27 LAB — CBC WITH DIFFERENTIAL/PLATELET
Abs Immature Granulocytes: 0.04 10*3/uL (ref 0.00–0.07)
Basophils Absolute: 0 10*3/uL (ref 0.0–0.1)
Basophils Relative: 0 %
Eosinophils Absolute: 0.1 10*3/uL (ref 0.0–0.5)
Eosinophils Relative: 1 %
HCT: 35.8 % — ABNORMAL LOW (ref 36.0–46.0)
Hemoglobin: 11.9 g/dL — ABNORMAL LOW (ref 12.0–15.0)
Immature Granulocytes: 0 %
Lymphocytes Relative: 15 %
Lymphs Abs: 1.7 10*3/uL (ref 0.7–4.0)
MCH: 34.3 pg — ABNORMAL HIGH (ref 26.0–34.0)
MCHC: 33.2 g/dL (ref 30.0–36.0)
MCV: 103.2 fL — ABNORMAL HIGH (ref 80.0–100.0)
Monocytes Absolute: 0.6 10*3/uL (ref 0.1–1.0)
Monocytes Relative: 5 %
Neutro Abs: 9.4 10*3/uL — ABNORMAL HIGH (ref 1.7–7.7)
Neutrophils Relative %: 79 %
Platelets: 423 10*3/uL — ABNORMAL HIGH (ref 150–400)
RBC: 3.47 MIL/uL — ABNORMAL LOW (ref 3.87–5.11)
RDW: 14.4 % (ref 11.5–15.5)
WBC: 11.8 10*3/uL — ABNORMAL HIGH (ref 4.0–10.5)
nRBC: 0 % (ref 0.0–0.2)

## 2023-09-27 LAB — IRON AND TIBC
Iron: 27 ug/dL — ABNORMAL LOW (ref 28–170)
Saturation Ratios: 6 % — ABNORMAL LOW (ref 10.4–31.8)
TIBC: 493 ug/dL — ABNORMAL HIGH (ref 250–450)
UIBC: 466 ug/dL

## 2023-09-27 LAB — VITAMIN B12: Vitamin B-12: 434 pg/mL (ref 180–914)

## 2023-09-27 LAB — FERRITIN: Ferritin: 10 ng/mL — ABNORMAL LOW (ref 11–307)

## 2023-09-27 LAB — FOLATE: Folate: 12.5 ng/mL (ref >5.9)

## 2023-09-27 MED ORDER — FERROUS SULFATE 325 (65 FE) MG PO TABS: 325.0000 mg | ORAL_TABLET | Freq: Every day | ORAL | 3 refills | Status: DC

## 2023-09-27 NOTE — Progress Notes (Signed)
 [facility]  HEMATOLOGY CLINIC PROGRESS NOTE  PATIENT NAME: Haley Hogan   MR#: [id number] DOB: 07/08/1986  Patient Care Team: Salvatore Decent, FNP as PCP - General (Internal Medicine) Covington Bing, MD as Consulting Physician (Obstetrics and Gynecology)  Date of visit: 09/27/2023   ASSESSMENT & PLAN:   Haley Hogan is a 38 y.o. 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    Iron deficiency anemia due to chronic blood loss - Presented with anemia with hemoglobin of 8.3, likely secondary to heavy menstrual bleeding from fibroids and low iron stores. Patient has symptoms of pica (ice craving).  -Recent iron labs at her OB/GYN office from 06/17/2023 reviewed.  Ferritin 6, iron saturation decreased at 2%, iron decreased at 11, iron binding capacity increased at 727.  Iron labs were indicative of severe iron deficiency.  Since she could not tolerate oral iron, we proceeded with IV iron in December 2024 using Feraheme x 2 doses, 1 week apart.   She was also found to have folate deficiency and has been taking folic acid 1 mg daily.  Significant symptom improvement after receiving IV iron and starting folic acid. Previous pica and cold intolerance resolved. Hemoglobin increased from 8 to 11.9 g/dL, platelets normalized from 724,000 to 423,000.  Ferritin remains low at 10 today.  Iron studies pending. Not taking oral iron due to constipation and taste issues. Discussed IV iron as an alternative.  -She was agreeable to starting oral iron supplements again.  Prescription sent to her pharmacy.  She was advised to take iron supplements with vitamin C to enhance absorption.  - Continue folic acid  - Consider alternate-day iron dosing if constipation occurs  -Plan to reevaluate her in 3 months with repeat  labs including iron studies.  Folic acid deficiency Low levels of vitamin D and folic acid noted. -Previously sent prescription for folic acid 1 mg daily. -Advised patient to consider over-the-counter multivitamin containing both Vitamin D and B vitamins, depending on cost-effectiveness.   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 Discussed rescheduling when support is available.  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  Platelet count is now improved at 423,000.   I spent a total of 25 minutes during this encounter with the patient including review of chart and various tests results, discussions about plan of care and coordination of care plan.  I reviewed lab results and outside records for this visit and discussed relevant results with the patient. Diagnosis, plan of care and treatment options were also discussed in detail with the patient. Opportunity provided to ask questions and answers provided to her apparent satisfaction. Provided instructions to call our clinic with any problems, questions or concerns prior to return visit. I recommended to continue follow-up with PCP and sub-specialists. She verbalized understanding and agreed with the plan. No barriers to learning was detected.  Meryl Crutch, MD  09/27/2023 3:11 PM  [facility] - [facility] DE[facility] [address] [place] [address] Dept: [phone] Dept Fax: [phone]   CHIEF COMPLAINT/ REASON FOR VISIT:  Follow-up for iron deficiency anemia, related to menorrhagia.  Also has folate deficiency.  INTERVAL HISTORY:  Discussed the use of AI scribe software for clinical note  transcription with the patient, who gave verbal consent to proceed.   Patient returns for follow up.   She received 2 doses of IV iron in December 2024.  She reports a significant improvement in her symptoms, including a decrease in her craving for ice and a reduction in her cold intolerance. However, she still prefers her drinks to be very cold. She also notes that she was particularly cold during the first few days of her menstrual cycle, which ended yesterday. She has been taking folic acid regularly, but stopped taking iron supplements due to constipation and an unpleasant taste.  In addition to her anemia, the patient has been seeing an OBGYN for heavy menstrual cycles and has been recommended for a hysterectomy. She has postponed the procedure due to a need for home care post-surgery. She also reports a pain in her left rib cage, which appears to be muscle-related and is being managed with heat and Tylenol.  SUMMARY OF HEMATOLOGIC HISTORY:  She established care with OB/GYN physician Dr. Vergie Living on 06/17/2023.  She has a history of uterine fibroids with secondary mass effects including difficulty voiding and constipation, in addition to heavy and painful monthly menses. Patient has been anemic at multiple ED visits with hemoglobin as low as 7 as a consequence of heavy menstrual bleeding but is not currently taking any supplements. Moved from [place] in 2021 and has full workup including ultrasounds and MRI but has been lost to OBGYN follow up since then.    On 06/17/2023, labs showed hemoglobin of 8.3, hematocrit 30, MCV 79.  White count was 8700 with normal differential, platelet count 774,000.  Ferritin low at 6, iron saturation decreased to 2%, iron binding capacity increased at 727, iron decreased at 11, all indicated of iron deficiency state.  Folic acid was also decreased at 2.4 ng/mL.  Hepatitis, HIV testing, RPR came back negative.  TSH was within normal limits.  Vitamin D low at 16.5  ng/mL.  Testing was positive for trichomonas and candida vaginitis.  On Pap smear, she was found to have high risk HPV and low-grade squamous intraepithelial lesion (LSIL).   She has been prescribed iron supplements but has stopped taking them due to constipation. She also has a history of low vitamin D levels. She has a craving for ice, which can be a symptom of iron deficiency anemia. She has not been experiencing chest pain or shortness of breath. She is concerned about a family history of cancer.   She denies recent chest pain on exertion, shortness of breath on minimal exertion, pre-syncopal episodes, or palpitations.   She had not noticed any recent bleeding such as epistaxis, hematuria or hematochezia   She had no prior history or diagnosis of cancer.   Since she could not tolerate oral iron, we proceeded with IV iron in December 2024 using Feraheme x 2 doses, 1 week apart.   She was also found to have folate deficiency and has been taking folic acid 1 mg daily.   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 Dr. Vergie Living is planning to proceed with hysterectomy for her.   I have reviewed the past medical history, past surgical history, social history and family history with the patient and they are unchanged from previous note.  ALLERGIES: She is allergic to cat  dander and penicillins.  MEDICATIONS:  Current Outpatient Medications  Medication Sig Dispense Refill   Cholecalciferol 50 MCG (2000 UT) TBDP One tablet daily. 90 tablet 0   cyproheptadine (PERIACTIN) 4 MG tablet Take 1 tablet (4 mg total) by mouth 2 (two) times daily. 180 tablet 1   folic acid (FOLVITE) 1 MG tablet Take 1 tablet (1 mg total) by mouth daily. 90 tablet 3   adapalene (DIFFERIN) 0.1 % cream Apply topically at bedtime. (Patient not taking: Reported on 09/27/2023) 45 g 0   cyclobenzaprine (FLEXERIL) 5 MG tablet Take 1 tablet (5 mg total) by mouth 3 (three) times daily as needed. (Patient not  taking: Reported on 09/27/2023) 15 tablet 0   ferrous sulfate 325 (65 FE) MG tablet Take 1 tablet (325 mg total) by mouth daily. 30 tablet 3   ibuprofen (ADVIL) 800 MG tablet Take 1 tablet (800 mg total) by mouth every 8 (eight) hours as needed. (Patient not taking: Reported on 09/27/2023) 30 tablet 0   No current facility-administered medications for this visit.     REVIEW OF SYSTEMS:    ROS  All other pertinent systems were reviewed with the patient and are negative.  PHYSICAL EXAMINATION:   Onc Performance Status - 09/27/23 1136       ECOG Perf Status   ECOG Perf Status Fully active, able to carry on all pre-disease performance without restriction      KPS SCALE   KPS % SCORE Normal, no compliants, no evidence of disease             Vitals:   09/27/23 1131  BP: 134/77  Pulse: 93  Resp: 18  Temp: 98.2 F (36.8 C)  SpO2: 100%   Filed Weights   09/27/23 1131  Weight: 134 lb 4.8 oz (60.9 kg)    Physical Exam Constitutional:      General: She is not in acute distress.    Appearance: Normal appearance.  HENT:     Head: Normocephalic and atraumatic.  Eyes:     General: No scleral icterus.    Conjunctiva/sclera: Conjunctivae normal.  Cardiovascular:     Rate and Rhythm: Normal rate and regular rhythm.     Heart sounds: Normal heart sounds.  Pulmonary:     Effort: Pulmonary effort is normal.     Breath sounds: Normal breath sounds.  Abdominal:     General: There is no distension.  Musculoskeletal:     Right lower leg: No edema.     Left lower leg: No edema.  Neurological:     General: No focal deficit present.     Mental Status: She is alert and oriented to person, place, and time.  Psychiatric:        Mood and Affect: Mood normal.        Behavior: Behavior normal.        Thought Content: Thought content normal.     LABORATORY DATA:   I have reviewed the data as listed.  Results for orders placed or performed in visit on 09/27/23  Ferritin  Result  Value Ref Range   Ferritin 10 (L) 11 - 307 ng/mL  CBC with Differential/Platelet  Result Value Ref Range   WBC 11.8 (H) 4.0 - 10.5 K/uL   RBC 3.47 (L) 3.87 - 5.11 MIL/uL   Hemoglobin 11.9 (L) 12.0 - 15.0 g/dL   HCT 40.9 (L) 81.1 - 91.4 %   MCV 103.2 (H) 80.0 - 100.0 fL   MCH 34.3 (H)  26.0 - 34.0 pg   MCHC 33.2 30.0 - 36.0 g/dL   RDW 09.8 11.9 - 14.7 %   Platelets 423 (H) 150 - 400 K/uL   nRBC 0.0 0.0 - 0.2 %   Neutrophils Relative % 79 %   Neutro Abs 9.4 (H) 1.7 - 7.7 K/uL   Lymphocytes Relative 15 %   Lymphs Abs 1.7 0.7 - 4.0 K/uL   Monocytes Relative 5 %   Monocytes Absolute 0.6 0.1 - 1.0 K/uL   Eosinophils Relative 1 %   Eosinophils Absolute 0.1 0.0 - 0.5 K/uL   Basophils Relative 0 %   Basophils Absolute 0.0 0.0 - 0.1 K/uL   Immature Granulocytes 0 %   Abs Immature Granulocytes 0.04 0.00 - 0.07 K/uL    RADIOGRAPHIC STUDIES: No recent pertinent imaging studies available to review.  Orders Placed This Encounter  Procedures   Iron and TIBC   CBC with Differential (Cancer Center Only)    Standing Status:   Future    Expected Date:   12/28/2023    Expiration Date:   09/26/2024   Folate    Standing Status:   Future    Expected Date:   12/28/2023    Expiration Date:   09/26/2024   Iron and TIBC    Standing Status:   Future    Expected Date:   12/28/2023    Expiration Date:   09/26/2024   Ferritin    Standing Status:   Future    Expected Date:   12/28/2023    Expiration Date:   09/26/2024     Future Appointments  Date Time Provider Department Center  09/28/2023  9:00 AM Salvatore Decent, FNP [facility]  12/28/2023 11:00 AM [facility] PHLEBOTOMIST [facility] None  12/28/2023 11:15 AM Kaely Hollan, Archie Patten, MD [facility] None     This document was completed utilizing speech recognition software. Grammatical errors, random word insertions, pronoun errors, and incomplete sentences are an occasional consequence of this system due to software limitations, ambient noise, and hardware issues. Any formal  questions or concerns about the content, text or information contained within the body of this dictation should be directly addressed to the provider for clarification.

## 2023-09-27 NOTE — Assessment & Plan Note (Signed)
 Low levels of vitamin D and folic acid noted. -Previously sent prescription for folic acid 1 mg daily. -Advised patient to consider over-the-counter multivitamin containing both Vitamin D and B vitamins, depending on cost-effectiveness.

## 2023-09-27 NOTE — Assessment & Plan Note (Signed)
 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 Discussed rescheduling when support is available.  - Coordinate with OBGYN for rescheduling hysterectomy when appropriate support is available

## 2023-09-27 NOTE — Assessment & Plan Note (Signed)
-   Presented with anemia with hemoglobin of 8.3, likely secondary to heavy menstrual bleeding from fibroids and low iron stores. Patient has symptoms of pica (ice craving).  -Recent iron labs at her OB/GYN office from 06/17/2023 reviewed.  Ferritin 6, iron saturation decreased at 2%, iron decreased at 11, iron binding capacity increased at 727.  Iron labs were indicative of severe iron deficiency.  Since she could not tolerate oral iron, we proceeded with IV iron in December 2024 using Feraheme x 2 doses, 1 week apart.   She was also found to have folate deficiency and has been taking folic acid 1 mg daily.  Significant symptom improvement after receiving IV iron and starting folic acid. Previous pica and cold intolerance resolved. Hemoglobin increased from 8 to 11.9 g/dL, platelets normalized from 724,000 to 423,000.  Ferritin remains low at 10 today.  Iron studies pending. Not taking oral iron due to constipation and taste issues. Discussed IV iron as an alternative.  -She was agreeable to starting oral iron supplements again.  Prescription sent to her pharmacy.  She was advised to take iron supplements with vitamin C to enhance absorption.  - Continue folic acid  - Consider alternate-day iron dosing if constipation occurs  -Plan to reevaluate her in 3 months with repeat labs including iron studies.

## 2023-09-27 NOTE — Assessment & Plan Note (Signed)
 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  Platelet count is now improved at 423,000.

## 2023-09-28 ENCOUNTER — Encounter: Payer: Self-pay | Admitting: Oncology

## 2023-09-28 ENCOUNTER — Encounter: Payer: Medicaid Other | Admitting: Internal Medicine

## 2023-09-29 ENCOUNTER — Other Ambulatory Visit: Payer: Self-pay | Admitting: Internal Medicine

## 2023-09-29 DIAGNOSIS — L700 Acne vulgaris: Secondary | ICD-10-CM

## 2023-10-19 ENCOUNTER — Encounter: Payer: Self-pay | Admitting: Internal Medicine

## 2023-10-19 ENCOUNTER — Other Ambulatory Visit: Payer: Self-pay

## 2023-10-19 ENCOUNTER — Telehealth: Payer: Self-pay

## 2023-10-19 ENCOUNTER — Ambulatory Visit (INDEPENDENT_AMBULATORY_CARE_PROVIDER_SITE_OTHER): Admitting: Internal Medicine

## 2023-10-19 VITALS — BP 110/70 | HR 90 | Temp 97.6°F | Ht 67.0 in | Wt 128.0 lb

## 2023-10-19 DIAGNOSIS — D219 Benign neoplasm of connective and other soft tissue, unspecified: Secondary | ICD-10-CM

## 2023-10-19 DIAGNOSIS — J302 Other seasonal allergic rhinitis: Secondary | ICD-10-CM

## 2023-10-19 DIAGNOSIS — J3489 Other specified disorders of nose and nasal sinuses: Secondary | ICD-10-CM

## 2023-10-19 DIAGNOSIS — R7303 Prediabetes: Secondary | ICD-10-CM | POA: Diagnosis not present

## 2023-10-19 DIAGNOSIS — Z21 Asymptomatic human immunodeficiency virus [HIV] infection status: Secondary | ICD-10-CM

## 2023-10-19 DIAGNOSIS — Z23 Encounter for immunization: Secondary | ICD-10-CM

## 2023-10-19 DIAGNOSIS — Z114 Encounter for screening for human immunodeficiency virus [HIV]: Secondary | ICD-10-CM

## 2023-10-19 DIAGNOSIS — Z0000 Encounter for general adult medical examination without abnormal findings: Secondary | ICD-10-CM | POA: Diagnosis not present

## 2023-10-19 DIAGNOSIS — Z1231 Encounter for screening mammogram for malignant neoplasm of breast: Secondary | ICD-10-CM

## 2023-10-19 DIAGNOSIS — R21 Rash and other nonspecific skin eruption: Secondary | ICD-10-CM

## 2023-10-19 MED ORDER — NAPROXEN 500 MG PO TABS: 500.0000 mg | ORAL_TABLET | Freq: Two times a day (BID) | ORAL | 0 refills | Status: AC | PRN

## 2023-10-19 MED ORDER — TRIAMCINOLONE ACETONIDE 0.1 % EX CREA: 1.0000 | TOPICAL_CREAM | Freq: Two times a day (BID) | CUTANEOUS | 1 refills | Status: AC

## 2023-10-19 MED ORDER — MUPIROCIN 2 % EX OINT: 1.0000 | TOPICAL_OINTMENT | Freq: Two times a day (BID) | CUTANEOUS | 1 refills | Status: DC | PRN

## 2023-10-19 MED ORDER — MONTELUKAST SODIUM 10 MG PO TABS: 10.0000 mg | ORAL_TABLET | Freq: Every day | ORAL | 1 refills | Status: AC

## 2023-10-19 NOTE — Progress Notes (Signed)
 Subjective:   Haley Hogan October 25, 1985  10/19/2023   CC: Chief Complaint  Patient presents with   Annual Exam    Not fasting     HPI: Haley Hogan is a 38 y.o. female who presents for a routine health maintenance exam.  Patient is not fasting today - she will come back for fasting labs.   Is prediabetic, needs A1c checked.  Has seasonal allergies - is controlled with singulair - needs refill.   Has uterine fibroids that are very painful - needs refill of naproxen. She is going to schedule for hysterectomy with OB/GYN.  She reports a lesion/growth in her right nostril that has been present for the past year.  She believes it has grown in size.  She also reports a rash to the bottom of her right foot.  Associated itching.  HEALTH SCREENINGS: - Pap smear:  with OBGYN - 05/2023 (LGSIL on colposcopy) OBGYN planning hysterectomy  - Mammogram (40+): Not applicable  - Colonoscopy (45+): Not applicable  - Bone Density (65+): Not applicable  - Lung CA screening with low-dose CT:  Not applicable Adults age 54-80 who are current cigarette smokers or quit within the last 15 years. Must have 20 pack year history.   Depression and Anxiety Screen done today and results listed below:     10/19/2023    2:15 PM 06/30/2023    3:28 PM 06/29/2023    9:45 AM  Depression screen PHQ 2/9  Decreased Interest 0 0 0  Down, Depressed, Hopeless 0 0 0  PHQ - 2 Score 0 0 0  Altered sleeping 0 2   Tired, decreased energy 0 2   Change in appetite 0 2   Feeling bad or failure about yourself  0 0   Trouble concentrating 0 0   Moving slowly or fidgety/restless 0 0   Suicidal thoughts 0 0   PHQ-9 Score 0 6   Difficult doing work/chores Not difficult at all Not difficult at all       10/19/2023    2:15 PM 06/30/2023    3:28 PM  GAD 7 : Generalized Anxiety Score  Nervous, Anxious, on Edge 0 0  Control/stop worrying 0 0  Worry too much - different things 0 1  Trouble relaxing 0 0  Restless 0  0  Easily annoyed or irritable 0 0  Afraid - awful might happen 0 0  Total GAD 7 Score 0 1  Anxiety Difficulty Not difficult at all Not difficult at all    IMMUNIZATIONS: - Tdap: Tetanus vaccination status reviewed: Td vaccination indicated and given today.   Past medical history, surgical history, medications, allergies, family history and social history reviewed with patient today and changes made to appropriate areas of the chart.   Past Medical History:  Diagnosis Date   Abnormal Pap smear of cervix    Anemia    Fibroids    Hypertension     Past Surgical History:  Procedure Laterality Date   COLPOSCOPY W/ BIOPSY / CURETTAGE  07/29/2023   ENDOMETRIAL BIOPSY  06/21/2023   LEEP     MANDIBLE FRACTURE SURGERY     SHOULDER ARTHROSCOPY      Current Outpatient Medications on File Prior to Visit  Medication Sig   Cholecalciferol 50 MCG (2000 UT) TBDP One tablet daily.   cyproheptadine (PERIACTIN) 4 MG tablet Take 1 tablet (4 mg total) by mouth 2 (two) times daily.   ferrous sulfate 325 (65 FE) MG tablet Take 1 tablet (  325 mg total) by mouth daily.   folic acid (FOLVITE) 1 MG tablet Take 1 tablet (1 mg total) by mouth daily.   adapalene (DIFFERIN) 0.1 % cream APPLY TOPICALLY AT BEDTIME   No current facility-administered medications on file prior to visit.    Allergies  Allergen Reactions   Cat Dander Hives    Allergy to cats   Penicillins Rash, Swelling and Other (See Comments)     Social History   Socioeconomic History   Marital status: Single    Spouse name: Not on file   Number of children: Not on file   Years of education: Not on file   Highest education level: Not on file  Occupational History   Not on file  Tobacco Use   Smoking status: Some Days    Types: Cigarettes, Cigars   Smokeless tobacco: Never  Vaping Use   Vaping status: Never Used  Substance and Sexual Activity   Alcohol use: Yes    Comment: socially   Drug use: Yes    Types: Marijuana    Sexual activity: Yes    Partners: Male  Other Topics Concern   Not on file  Social History Narrative   Not on file   Social Drivers of Health   Financial Resource Strain: Not on file  Food Insecurity: No Food Insecurity (06/29/2023)   Hunger Vital Sign    Worried About Running Out of Food in the Last Year: Never true    Ran Out of Food in the Last Year: Never true  Transportation Needs: No Transportation Needs (06/29/2023)   PRAPARE - Administrator, Civil Service (Medical): No    Lack of Transportation (Non-Medical): No  Physical Activity: Not on file  Stress: Not on file  Social Connections: Unknown (01/05/2023)   [facility]   Social Network    Social Network: Not on file  Intimate Partner Violence: Not At Risk (06/29/2023)   Humiliation, Afraid, Rape, and Kick questionnaire    Fear of Current or Ex-Partner: No    Emotionally Abused: No    Physically Abused: No    Sexually Abused: No   Social History   Tobacco Use  Smoking Status Some Days   Types: Cigarettes, Cigars  Smokeless Tobacco Never   Social History   Substance and Sexual Activity  Alcohol Use Yes   Comment: socially    Family History  Problem Relation Age of Onset   Cancer Maternal Grandmother      ROS: Denies fever, fatigue, unexplained weight loss/gain, hearing or vision changes, cardiac or respiratory complaints. Denies neurological deficits, musculoskeletal complaints, gastrointestinal or genitourinary complaints, mental health complaints, and skin changes.   Objective:   Today's Vitals   10/19/23 1407  BP: 110/70  Pulse: 90  Temp: 97.6 F (36.4 C)  TempSrc: Temporal  SpO2: 99%  Weight: 128 lb (58.1 kg)  Height: 5\' 7"  (1.702 m)    GENERAL APPEARANCE: Well-appearing, in NAD. Well nourished.  SKIN: Pink, warm and dry. Turgor normal. No ulceration, or ecchymoses. Hair evenly distributed.  Dry, erythematous maculopapular rash to plantar aspect of  right foot. HEENT: HEAD: Normocephalic.  EYES: PERRLA. EOMI. Lids intact w/o defect. Sclera white, Conjunctiva pink w/o exudate.  EARS: External ear w/o redness, swelling, masses or lesions. EAC clear. TM's intact, translucent w/o bulging, appropriate landmarks visualized. Appropriate acuity to conversational tones.  NOSE: Septum midline w/o deformity. Nares patent, mucosa pink and non-inflamed w/o drainage.  Flesh colored lesion  to anterior wall of right nare THROAT: Uvula midline. Oropharynx clear. Tonsils non-inflamed w/o exudate . Oral mucosa pink and moist.  NECK: Supple, Trachea midline. Full ROM w/o pain or tenderness. No lymphadenopathy. Thyroid non-tender w/o enlargement or palpable masses.  BREASTS: Breasts pendulous, symmetrical, and w/o palpable masses. Nipples everted and w/o discharge. No rash or skin retraction. No axillary or supraclavicular lymphadenopathy.  RESPIRATORY: Chest wall symmetrical w/o masses. Respirations even and non-labored. Breath sounds clear to auscultation bilaterally. No wheezes, rales, rhonchi, or crackles. CARDIAC: S1, S2 present, regular rate and rhythm. No gallops, murmurs, rubs, or clicks.Capillary refill <2 seconds. Peripheral pulses 2+ bilaterally. GI: Abdomen soft w/o distention. Normoactive bowel sounds. No palpable masses or tenderness. No guarding or rebound tenderness. Liver and spleen w/o tenderness or enlargement. No CVA tenderness.  MSK: Muscle tone and strength appropriate for age, w/o atrophy or abnormal movement.  EXTREMITIES: Active ROM intact, w/o tenderness, crepitus, or contracture. No obvious joint deformities or effusions. No clubbing, edema, or cyanosis.  NEUROLOGIC: CN's II-XII intact. Motor strength symmetrical with no obvious weakness. No sensory deficits. Steady, even gait.  PSYCH/MENTAL STATUS: Alert, oriented x 3. Cooperative, appropriate mood and affect.    Results for orders placed or performed in visit on 09/27/23  Iron and  TIBC   Collection Time: 09/27/23 11:30 AM  Result Value Ref Range   Iron 27 (L) 28 - 170 ug/dL   TIBC 161 (H) 096 - 045 ug/dL   Saturation Ratios 6 (L) 10.4 - 31.8 %   UIBC 466 ug/dL    Assessment & Plan:  Encounter for general adult medical examination without abnormal findings -     Comprehensive metabolic panel; Future -     Lipid panel; Future -     TSH; Future  Immunization due -     Tdap vaccine greater than or equal to 7yo IM  Breast cancer screening by mammogram -     3D Screening Mammogram, Left and Right; Future  Prediabetes -     Hemoglobin A1c; Future  Seasonal allergies -     Montelukast Sodium; Take 1 tablet (10 mg total) by mouth at bedtime.  Dispense: 90 tablet; Refill: 1  Fibroids -     Naproxen; Take 1 tablet (500 mg total) by mouth 2 (two) times daily as needed.  Dispense: 30 tablet; Refill: 0  Rash -     Triamcinolone Acetonide; Apply 1 Application topically 2 (two) times daily.  Dispense: 45 g; Refill: 1  Lesion of nose -     Ambulatory referral to ENT  Other orders -     Mupirocin; Apply 1 Application topically 2 (two) times daily as needed.  Dispense: 22 g; Refill: 1    Orders Placed This Encounter  Procedures   MM 3D SCREENING MAMMOGRAM BILATERAL BREAST    Standing Status:   Future    Expiration Date:   10/18/2024    Reason for Exam (SYMPTOM  OR DIAGNOSIS REQUIRED):   screening for breast cancer    [facility]    Is the patient pregnant?:   No   Tdap vaccine greater than or equal to 7yo IM   Comprehensive metabolic panel    Standing Status:   Future    Expiration Date:   04/20/2024   Lipid panel    Standing Status:   Future    Expiration Date:   04/20/2024   TSH    Standing Status:   Future  Expiration Date:   04/20/2024   Hemoglobin A1C    Standing Status:   Future    Expiration Date:   10/18/2024   Ambulatory referral to ENT    Referral Priority:   Routine    Referral Type:   Consultation     Referral Reason:   Specialty Services Required    Referred to Provider:   Mack Hook., MD    Number of Visits Requested:   1    PATIENT COUNSELING:  -  healthy well-balanced diet. Patient may adjust caloric intake to maintain or achieve ideal body weight. May reduce intake of dietary saturated fat and total fat and have adequate dietary potassium and calcium preferably from fresh fruits, vegetables, and low-fat dairy products.   -  importance of regular exercise  NEXT PREVENTATIVE PHYSICAL DUE IN 1 YEAR.  Return in about 6 months (around 04/20/2024) for Chronic Condition follow up.  Salvatore Decent, FNP

## 2023-10-19 NOTE — Telephone Encounter (Signed)
 Copied from CRM [id number]. Topic: Clinical - Medical Advice >> Oct 19, 2023  3:22 PM Elizebeth Brooking wrote: Reason for CRM: Patient called in stated for her lab appointment on 03/27 wanted to know if she could add HIV to be tested on for her lab appointment

## 2023-10-19 NOTE — Telephone Encounter (Signed)
Yes that is fine, Thank you.

## 2023-10-19 NOTE — Addendum Note (Signed)
 Addended by: Leroy Kennedy on: 10/19/2023 04:28 PM   Modules accepted: Orders

## 2023-10-19 NOTE — Telephone Encounter (Signed)
 Forwarding message below. HIV test was ordered and placed in future for upcoming lab visit on 10/21/2023. Is this ok with you Haley Hogan? Please advise.

## 2023-10-19 NOTE — Patient Instructions (Signed)
Return for fasting lab work. 

## 2023-10-20 ENCOUNTER — Telehealth: Payer: Self-pay

## 2023-10-20 NOTE — Telephone Encounter (Signed)
 Forwarding message below RX refill request.

## 2023-10-20 NOTE — Telephone Encounter (Signed)
 Copied from CRM [id number]. Topic: Clinical - Prescription Issue >> Oct 20, 2023 11:33 AM Lennart Pall wrote: Reason for CRM: Patient was told her was going to get a prescription for cyclobenzaprine (FLEXERIL) 5 MG tablet and it was not at her pharmacy.

## 2023-10-21 ENCOUNTER — Other Ambulatory Visit: Payer: Self-pay | Admitting: Internal Medicine

## 2023-10-21 ENCOUNTER — Other Ambulatory Visit (INDEPENDENT_AMBULATORY_CARE_PROVIDER_SITE_OTHER)

## 2023-10-21 DIAGNOSIS — Z0000 Encounter for general adult medical examination without abnormal findings: Secondary | ICD-10-CM | POA: Diagnosis not present

## 2023-10-21 DIAGNOSIS — R7303 Prediabetes: Secondary | ICD-10-CM | POA: Diagnosis not present

## 2023-10-21 DIAGNOSIS — Z1231 Encounter for screening mammogram for malignant neoplasm of breast: Secondary | ICD-10-CM

## 2023-10-21 DIAGNOSIS — Z114 Encounter for screening for human immunodeficiency virus [HIV]: Secondary | ICD-10-CM

## 2023-10-21 DIAGNOSIS — D500 Iron deficiency anemia secondary to blood loss (chronic): Secondary | ICD-10-CM

## 2023-10-21 DIAGNOSIS — E538 Deficiency of other specified B group vitamins: Secondary | ICD-10-CM

## 2023-10-21 LAB — COMPREHENSIVE METABOLIC PANEL WITH GFR
ALT: 16 U/L (ref 0–35)
AST: 37 U/L (ref 0–37)
Albumin: 4.4 g/dL (ref 3.5–5.2)
Alkaline Phosphatase: 68 U/L (ref 39–117)
BUN: 13 mg/dL (ref 6–23)
CO2: 29 meq/L (ref 19–32)
Calcium: 9 mg/dL (ref 8.4–10.5)
Chloride: 99 meq/L (ref 96–112)
Creatinine, Ser: 0.81 mg/dL (ref 0.40–1.20)
GFR: 91.21 mL/min (ref >60.00)
Glucose, Bld: 86 mg/dL (ref 70–99)
Potassium: 3.5 meq/L (ref 3.5–5.1)
Sodium: 138 meq/L (ref 135–145)
Total Bilirubin: 0.8 mg/dL (ref 0.2–1.2)
Total Protein: 7.4 g/dL (ref 6.0–8.3)

## 2023-10-21 LAB — LIPID PANEL
Cholesterol: 179 mg/dL (ref 0–200)
HDL: 107.1 mg/dL (ref >39.00)
LDL Cholesterol: 54 mg/dL (ref 0–99)
NonHDL: 72.26
Total CHOL/HDL Ratio: 2
Triglycerides: 91 mg/dL (ref 0.0–149.0)
VLDL: 18.2 mg/dL (ref 0.0–40.0)

## 2023-10-21 LAB — HEMOGLOBIN A1C: Hgb A1c MFr Bld: 4.1 % — ABNORMAL LOW (ref 4.6–6.5)

## 2023-10-21 MED ORDER — CYCLOBENZAPRINE HCL 10 MG PO TABS: 10.0000 mg | ORAL_TABLET | Freq: Three times a day (TID) | ORAL | 0 refills | Status: AC | PRN

## 2023-10-21 NOTE — Telephone Encounter (Signed)
 Rx sent

## 2023-10-22 LAB — HIV ANTIBODY (ROUTINE TESTING W REFLEX): HIV 1&2 Ab, 4th Generation: NONREACTIVE

## 2023-10-22 LAB — TSH: TSH: 0.75 u[IU]/mL (ref 0.35–5.50)

## 2023-10-25 ENCOUNTER — Encounter: Payer: Self-pay | Admitting: Internal Medicine

## 2023-12-28 ENCOUNTER — Telehealth: Payer: Self-pay | Admitting: Oncology

## 2023-12-28 ENCOUNTER — Inpatient Hospital Stay: Admitting: Oncology

## 2023-12-28 ENCOUNTER — Encounter: Payer: Self-pay | Admitting: Oncology

## 2023-12-28 ENCOUNTER — Inpatient Hospital Stay: Attending: Oncology

## 2023-12-28 ENCOUNTER — Other Ambulatory Visit: Payer: Self-pay | Admitting: Oncology

## 2023-12-28 VITALS — BP 125/76 | HR 81 | Temp 98.1°F | Resp 18 | Ht 67.0 in | Wt 126.0 lb

## 2023-12-28 DIAGNOSIS — D7589 Other specified diseases of blood and blood-forming organs: Secondary | ICD-10-CM | POA: Insufficient documentation

## 2023-12-28 DIAGNOSIS — N870 Mild cervical dysplasia: Secondary | ICD-10-CM | POA: Diagnosis not present

## 2023-12-28 DIAGNOSIS — F1010 Alcohol abuse, uncomplicated: Secondary | ICD-10-CM | POA: Insufficient documentation

## 2023-12-28 DIAGNOSIS — D500 Iron deficiency anemia secondary to blood loss (chronic): Secondary | ICD-10-CM

## 2023-12-28 DIAGNOSIS — N920 Excessive and frequent menstruation with regular cycle: Secondary | ICD-10-CM | POA: Insufficient documentation

## 2023-12-28 DIAGNOSIS — E538 Deficiency of other specified B group vitamins: Secondary | ICD-10-CM | POA: Diagnosis not present

## 2023-12-28 LAB — CBC WITH DIFFERENTIAL (CANCER CENTER ONLY)
Abs Immature Granulocytes: 0.02 10*3/uL (ref 0.00–0.07)
Basophils Absolute: 0 10*3/uL (ref 0.0–0.1)
Basophils Relative: 0 %
Eosinophils Absolute: 0.1 10*3/uL (ref 0.0–0.5)
Eosinophils Relative: 1 %
HCT: 37 % (ref 36.0–46.0)
Hemoglobin: 12.4 g/dL (ref 12.0–15.0)
Immature Granulocytes: 0 %
Lymphocytes Relative: 19 %
Lymphs Abs: 1.8 10*3/uL (ref 0.7–4.0)
MCH: 35.4 pg — ABNORMAL HIGH (ref 26.0–34.0)
MCHC: 33.5 g/dL (ref 30.0–36.0)
MCV: 105.7 fL — ABNORMAL HIGH (ref 80.0–100.0)
Monocytes Absolute: 0.7 10*3/uL (ref 0.1–1.0)
Monocytes Relative: 7 %
Neutro Abs: 6.8 10*3/uL (ref 1.7–7.7)
Neutrophils Relative %: 73 %
Platelet Count: 396 10*3/uL (ref 150–400)
RBC: 3.5 MIL/uL — ABNORMAL LOW (ref 3.87–5.11)
RDW: 14 % (ref 11.5–15.5)
WBC Count: 9.4 10*3/uL (ref 4.0–10.5)
nRBC: 0 % (ref 0.0–0.2)

## 2023-12-28 LAB — IRON AND TIBC
Iron: 46 ug/dL (ref 28–170)
Saturation Ratios: 9 % — ABNORMAL LOW (ref 10.4–31.8)
TIBC: 510 ug/dL — ABNORMAL HIGH (ref 250–450)
UIBC: 464 ug/dL

## 2023-12-28 LAB — FERRITIN: Ferritin: 24 ng/mL (ref 11–307)

## 2023-12-28 MED ORDER — FERROUS SULFATE 325 (65 FE) MG PO TABS: 325.0000 mg | ORAL_TABLET | Freq: Every day | ORAL | 3 refills | Status: DC

## 2023-12-28 NOTE — Progress Notes (Signed)
 [facility]  HEMATOLOGY CLINIC PROGRESS NOTE  PATIENT NAME: Haley Hogan   MR#: [id number] DOB: 12/19/1985  Patient Care Team: Gavin Kast, FNP as PCP - General (Internal Medicine) Raynell Caller, MD as Consulting Physician (Obstetrics and Gynecology)  Date of visit: 12/28/2023   ASSESSMENT & PLAN:   Haley Hogan is a 38 y.o. 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    Iron deficiency anemia due to chronic blood loss - Presented with anemia with hemoglobin of 8.3, likely secondary to heavy menstrual bleeding from fibroids and low iron stores. Patient has symptoms of pica (ice craving).  -Recent iron labs at her OB/GYN office from 06/17/2023 reviewed.  Ferritin 6, iron saturation decreased at 2%, iron decreased at 11, iron binding capacity increased at 727.  Iron labs were indicative of severe iron deficiency.  Since she could not tolerate oral iron, we proceeded with IV iron in December 2024 using Feraheme x 2 doses, 1 week apart.   She was also found to have folate deficiency and has been taking folic acid  1 mg daily.  Significant symptom improvement after receiving IV iron and starting folic acid . Previous pica and cold intolerance resolved. Hemoglobin increased from 8 to 11.9 g/dL, platelets normalized from 724,000 to 423,000.   -She was agreeable to starting oral iron supplements again when she started this from March 2025.  Prescription sent to her pharmacy.  She was advised to take iron supplements with vitamin C to enhance absorption.  - Continue folic acid   - Consider alternate-day iron dosing if constipation occurs  Labs today showed stable hemoglobin of 12.4.  Ferritin normal at 24.  Iron studies pending.  Currently no indication for IV iron.  -Plan to reevaluate her in 3  months with repeat labs including iron studies.  Folic acid  deficiency Low levels of vitamin D  and folic acid  noted. -Previously sent prescription for folic acid  1 mg daily. -Advised patient to consider over-the-counter multivitamin containing both Vitamin D  and B vitamins, depending on cost-effectiveness.   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 Discussed rescheduling when support is available.  - Coordinate with OBGYN for rescheduling hysterectomy when appropriate support is available  Macrocytosis Macrocytosis with increased red blood cell size. Alcohol consumption is a possible contributing factor. Folic acid  supplementation is ongoing. - Continue folic acid  supplementation. - Advise limiting alcohol consumption to reduce macrocytosis risk.  Alcohol abuse Consumes wine every other day and brandy on weekends, averaging 8 drinks per week. Alcohol consumption may contribute to macrocytosis and poses risks for liver damage, cirrhosis, and various cancers including liver, stomach, and breast cancer. - Advise limiting alcohol consumption to one drink per day to reduce health risks.  Menorrhagia Ongoing heavy menstrual bleeding with recent cycle ending on Saturday. Hemoglobin levels are stable post-menstrual cycle. Planning a hysterectomy but postponed for adequate post-surgery support. - Monitor hemoglobin levels post-menstrual cycle. - Plan for hysterectomy at the end of September or beginning of October.   I spent a total of 25 minutes during this encounter with the patient including review of chart and various tests results, discussions about plan of care and coordination of care plan.  I reviewed lab results and outside  records for this visit and discussed relevant results with the patient. Diagnosis, plan of care and treatment  options were also discussed in detail with the patient. Opportunity provided to ask questions and answers provided to her apparent satisfaction. Provided instructions to call our clinic with any problems, questions or concerns prior to return visit. I recommended to continue follow-up with PCP and sub-specialists. She verbalized understanding and agreed with the plan. No barriers to learning was detected.  Arlo Berber, MD  12/28/2023 5:51 PM  [facility] - A DEPT OF [facility] [id number] [place] [address] Dept: [phone] Dept Fax: [phone]   CHIEF COMPLAINT/ REASON FOR VISIT:  Follow-up for iron deficiency anemia, related to menorrhagia.  Also has folate deficiency.  INTERVAL HISTORY:  Discussed the use of AI scribe software for clinical note transcription with the patient, who gave verbal consent to proceed.  History of Present Illness Haley Hogan is a 38 year old female who presents for follow-up on her lab results and management of her iron deficiency anemia.  Her hemoglobin is 12.4 g/dL. Iron levels from the last visit in March were low. She has been taking oral ferrous sulfate  and folic acid  since then, tolerating the ferrous sulfate  well but requiring a stool softener to manage constipation. She has not been taking vitamin C with her iron supplements.  She is considering a hysterectomy, which she plans to schedule for the end of September or early October. Her menstrual bleeding is still heavy, although she recently completed her cycle. She is currently living alone and wants to ensure she has adequate support post-surgery.  She consumes alcohol, specifically wine every other day and brandy on weekends, averaging about eight drinks per week.    SUMMARY OF HEMATOLOGIC HISTORY:  She established care with OB/GYN physician Dr. Aquilla Knapp on 06/17/2023.  She has a history of uterine fibroids with  secondary mass effects including difficulty voiding and constipation, in addition to heavy and painful monthly menses. Patient has been anemic at multiple ED visits with hemoglobin as low as 7 as a consequence of heavy menstrual bleeding but is not currently taking any supplements. Moved from [place]  in 2021 and has full workup including ultrasounds and MRI but has been lost to OBGYN follow up since then.    On 06/17/2023, labs showed hemoglobin of 8.3, hematocrit 30, MCV 79.  White count was 8700 with normal differential, platelet count 774,000.  Ferritin low at 6, iron saturation decreased to 2%, iron binding capacity increased at 727, iron decreased at 11, all indicated of iron deficiency state.  Folic acid  was also decreased at 2.4 ng/mL.  Hepatitis, HIV testing, RPR came back negative.  TSH was within normal limits.  Vitamin D  low at 16.5 ng/mL.  Testing was positive for trichomonas and candida vaginitis.  On Pap smear, she was found to have high risk HPV and low-grade squamous intraepithelial lesion (LSIL).   She has been prescribed iron supplements but has stopped taking them due to constipation. She also has a history of low vitamin D  levels. She has a craving for ice, which can be a symptom of iron deficiency anemia. She has not been experiencing chest pain or shortness of breath. She is concerned about a family history of cancer.   She denies recent chest pain on exertion, shortness of breath on minimal exertion, pre-syncopal episodes, or palpitations.   She had not noticed any recent bleeding such as  epistaxis, hematuria or hematochezia   She had no prior history or diagnosis of cancer.   Since she could not tolerate oral iron, we proceeded with IV iron in December 2024 using Feraheme x 2 doses, 1 week apart.   She was also found to have folate deficiency and has been taking folic acid  1 mg daily.   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 Dr. Aquilla Knapp is  planning to proceed with hysterectomy for her.   I have reviewed the past medical history, past surgical history, social history and family history with the patient and they are unchanged from previous note.  ALLERGIES: She is allergic to cat dander and penicillins.  MEDICATIONS:  Current Outpatient Medications  Medication Sig Dispense Refill   adapalene  (DIFFERIN ) 0.1 % cream APPLY TOPICALLY AT BEDTIME 45 g 0   Cholecalciferol  50 MCG (2000 UT) TBDP One tablet daily. 90 tablet 0   cyclobenzaprine  (FLEXERIL ) 10 MG tablet Take 1 tablet (10 mg total) by mouth 3 (three) times daily as needed for muscle spasms. 30 tablet 0   cyproheptadine  (PERIACTIN ) 4 MG tablet Take 1 tablet (4 mg total) by mouth 2 (two) times daily. 180 tablet 1   folic acid  (FOLVITE ) 1 MG tablet Take 1 tablet (1 mg total) by mouth daily. 90 tablet 3   montelukast  (SINGULAIR ) 10 MG tablet Take 1 tablet (10 mg total) by mouth at bedtime. 90 tablet 1   mupirocin  ointment (BACTROBAN ) 2 % Apply 1 Application topically 2 (two) times daily as needed. 22 g 1   naproxen  (NAPROSYN ) 500 MG tablet Take 1 tablet (500 mg total) by mouth 2 (two) times daily as needed. 30 tablet 0   triamcinolone  cream (KENALOG ) 0.1 % Apply 1 Application topically 2 (two) times daily. 45 g 1   ferrous sulfate  325 (65 FE) MG tablet Take 1 tablet (325 mg total) by mouth daily. 90 tablet 3   No current facility-administered medications for this visit.     REVIEW OF SYSTEMS:    ROS  All other pertinent systems were reviewed with the patient and are negative.  PHYSICAL EXAMINATION:   Onc Performance Status - 12/28/23 1204       ECOG Perf Status   ECOG Perf Status Fully active, able to carry on all pre-disease performance without restriction      KPS SCALE   KPS % SCORE Normal, no compliants, no evidence of disease              Vitals:   12/28/23 1157  BP: 125/76  Pulse: 81  Resp: 18  Temp: 98.1 F (36.7 C)  SpO2: 99%   Filed Weights    12/28/23 1157  Weight: 126 lb (57.2 kg)    Physical Exam Constitutional:      General: She is not in acute distress.    Appearance: Normal appearance.  HENT:     Head: Normocephalic and atraumatic.  Eyes:     General: No scleral icterus.    Conjunctiva/sclera: Conjunctivae normal.  Cardiovascular:     Rate and Rhythm: Normal rate and regular rhythm.     Heart sounds: Normal heart sounds.  Pulmonary:     Effort: Pulmonary effort is normal.     Breath sounds: Normal breath sounds.  Abdominal:     General: There is no distension.  Musculoskeletal:     Right lower leg: No edema.     Left lower leg: No edema.  Neurological:  General: No focal deficit present.     Mental Status: She is alert and oriented to person, place, and time.  Psychiatric:        Mood and Affect: Mood normal.        Behavior: Behavior normal.        Thought Content: Thought content normal.     LABORATORY DATA:   I have reviewed the data as listed.  Results for orders placed or performed in visit on 12/28/23  Ferritin  Result Value Ref Range   Ferritin 24 11 - 307 ng/mL  Iron and TIBC  Result Value Ref Range   Iron 46 28 - 170 ug/dL   TIBC 034 (H) 742 - 595 ug/dL   Saturation Ratios 9 (L) 10.4 - 31.8 %   UIBC 464 ug/dL  CBC with Differential (Cancer Center Only)  Result Value Ref Range   WBC Count 9.4 4.0 - 10.5 K/uL   RBC 3.50 (L) 3.87 - 5.11 MIL/uL   Hemoglobin 12.4 12.0 - 15.0 g/dL   HCT 63.8 75.6 - 43.3 %   MCV 105.7 (H) 80.0 - 100.0 fL   MCH 35.4 (H) 26.0 - 34.0 pg   MCHC 33.5 30.0 - 36.0 g/dL   RDW 29.5 18.8 - 41.6 %   Platelet Count 396 150 - 400 K/uL   nRBC 0.0 0.0 - 0.2 %   Neutrophils Relative % 73 %   Neutro Abs 6.8 1.7 - 7.7 K/uL   Lymphocytes Relative 19 %   Lymphs Abs 1.8 0.7 - 4.0 K/uL   Monocytes Relative 7 %   Monocytes Absolute 0.7 0.1 - 1.0 K/uL   Eosinophils Relative 1 %   Eosinophils Absolute 0.1 0.0 - 0.5 K/uL   Basophils Relative 0 %   Basophils Absolute  0.0 0.0 - 0.1 K/uL   Immature Granulocytes 0 %   Abs Immature Granulocytes 0.02 0.00 - 0.07 K/uL    RADIOGRAPHIC STUDIES: No recent pertinent imaging studies available to review.  Orders Placed This Encounter  Procedures   CBC with Differential (Cancer Center Only)    Standing Status:   Future    Expected Date:   03/29/2024    Expiration Date:   12/27/2024   Ferritin    Standing Status:   Future    Expected Date:   03/29/2024    Expiration Date:   12/27/2024   Iron and TIBC    Standing Status:   Future    Expected Date:   03/29/2024    Expiration Date:   12/27/2024   Folate    Standing Status:   Future    Expected Date:   03/29/2024    Expiration Date:   12/27/2024   Vitamin B12    Standing Status:   Future    Expected Date:   03/29/2024    Expiration Date:   12/27/2024     Future Appointments  Date Time Provider Department Center  01/04/2024  3:30 PM Granville Layer, MD [facility]  02/03/2024  5:10 PM [facility] MM 3 [facility]  04/03/2024 10:30 AM [facility] PHLEBOTOMIST [facility] None  04/03/2024 10:45 AM Lirio Bach, Gale Jude, MD [facility] None  04/20/2024  3:20 PM Gavin Kast, FNP [facility]     This document was completed utilizing speech recognition software. Grammatical errors, random word insertions, pronoun errors, and incomplete sentences are an occasional consequence of this system due to software limitations, ambient noise, and hardware issues. Any formal questions or concerns about the content, text  or information contained within the body of this dictation should be directly addressed to the provider for clarification.

## 2023-12-28 NOTE — Assessment & Plan Note (Signed)
 Consumes wine every other day and brandy on weekends, averaging 8 drinks per week. Alcohol consumption may contribute to macrocytosis and poses risks for liver damage, cirrhosis, and various cancers including liver, stomach, and breast cancer. - Advise limiting alcohol consumption to one drink per day to reduce health risks.

## 2023-12-28 NOTE — Assessment & Plan Note (Signed)
 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 Discussed rescheduling when support is available.  - Coordinate with OBGYN for rescheduling hysterectomy when appropriate support is available

## 2023-12-28 NOTE — Assessment & Plan Note (Addendum)
-   Presented with anemia with hemoglobin of 8.3, likely secondary to heavy menstrual bleeding from fibroids and low iron stores. Patient has symptoms of pica (ice craving).  -Recent iron labs at her OB/GYN office from 06/17/2023 reviewed.  Ferritin 6, iron saturation decreased at 2%, iron decreased at 11, iron binding capacity increased at 727.  Iron labs were indicative of severe iron deficiency.  Since she could not tolerate oral iron, we proceeded with IV iron in December 2024 using Feraheme x 2 doses, 1 week apart.   She was also found to have folate deficiency and has been taking folic acid  1 mg daily.  Significant symptom improvement after receiving IV iron and starting folic acid . Previous pica and cold intolerance resolved. Hemoglobin increased from 8 to 11.9 g/dL, platelets normalized from 724,000 to 423,000.   -She was agreeable to starting oral iron supplements again when she started this from March 2025.  Prescription sent to her pharmacy.  She was advised to take iron supplements with vitamin C to enhance absorption.  - Continue folic acid   - Consider alternate-day iron dosing if constipation occurs  Labs today showed stable hemoglobin of 12.4.  Ferritin normal at 24.  Iron studies pending.  Currently no indication for IV iron.  -Plan to reevaluate her in 3 months with repeat labs including iron studies.

## 2023-12-28 NOTE — Assessment & Plan Note (Signed)
 Low levels of vitamin D and folic acid noted. -Previously sent prescription for folic acid 1 mg daily. -Advised patient to consider over-the-counter multivitamin containing both Vitamin D and B vitamins, depending on cost-effectiveness.

## 2023-12-28 NOTE — Assessment & Plan Note (Signed)
 Macrocytosis with increased red blood cell size. Alcohol consumption is a possible contributing factor. Folic acid  supplementation is ongoing. - Continue folic acid  supplementation. - Advise limiting alcohol consumption to reduce macrocytosis risk.

## 2023-12-28 NOTE — Telephone Encounter (Signed)
 Patient has been scheduled for follow-up visit per 12/27/23 LOS.  Pt aware of scheduled appt details.

## 2024-01-04 ENCOUNTER — Ambulatory Visit: Admitting: Family Medicine

## 2024-01-04 VITALS — BP 122/80 | HR 86 | Wt 126.2 lb

## 2024-01-04 DIAGNOSIS — D500 Iron deficiency anemia secondary to blood loss (chronic): Secondary | ICD-10-CM | POA: Diagnosis not present

## 2024-01-04 DIAGNOSIS — Z113 Encounter for screening for infections with a predominantly sexual mode of transmission: Secondary | ICD-10-CM

## 2024-01-04 DIAGNOSIS — N939 Abnormal uterine and vaginal bleeding, unspecified: Secondary | ICD-10-CM

## 2024-01-04 DIAGNOSIS — L0292 Furuncle, unspecified: Secondary | ICD-10-CM

## 2024-01-04 MED ORDER — TRANEXAMIC ACID 650 MG PO TABS: 1300.0000 mg | ORAL_TABLET | Freq: Three times a day (TID) | ORAL | 2 refills | Status: AC

## 2024-01-04 MED ORDER — DOXYCYCLINE HYCLATE 100 MG PO CAPS: 100.0000 mg | ORAL_CAPSULE | Freq: Two times a day (BID) | ORAL | 0 refills | Status: DC

## 2024-01-04 NOTE — Progress Notes (Unsigned)
 CC: abnormal pap-06/17/23 Colpo-07/29/23 Cancelled hysterectomy-09/10/23    Has a boil that she would like to have Dr.Pratt look at and also wants STD panel and swab to be done as condom came off inside of her about 2-3 weeks ago

## 2024-01-04 NOTE — Progress Notes (Unsigned)
   Subjective:    Patient ID: Haley Hogan is a 38 y.o. female presenting with No chief complaint on file.  on 01/04/2024  HPI: 9 days ago, developed a boil. Has diarrhea while on her cycle. Used wipes and notes that maybe she got a abrasion which might have gotten infected.  ? Draining. Seems not as painful. Not taking anything for this. Using anti-bacterial and Bactroban  at home. Using alcohol as well. Had a condom come off and would like STD check. Has some heavy bleeding and known fibroids. For Hysterectomy with Dr. Aquilla Knapp.  Review of Systems  Constitutional:  Negative for chills and fever.  Respiratory:  Negative for shortness of breath.   Cardiovascular:  Negative for chest pain.  Gastrointestinal:  Negative for abdominal pain, nausea and vomiting.  Genitourinary:  Negative for dysuria.  Skin:  Negative for rash.      Objective:    BP 122/80   Pulse 86   Wt 126 lb 3.2 oz (57.2 kg)   LMP 12/21/2023 (Approximate)   BMI 19.77 kg/m  Physical Exam Exam conducted with a chaperone present.  Constitutional:      General: She is not in acute distress.    Appearance: She is well-developed.  HENT:     Head: Normocephalic and atraumatic.  Eyes:     General: No scleral icterus. Cardiovascular:     Rate and Rhythm: Normal rate.  Pulmonary:     Effort: Pulmonary effort is normal.  Abdominal:     Palpations: Abdomen is soft.  Genitourinary:    Comments: Small boil at the rectum, with head on it. Musculoskeletal:     Cervical back: Neck supple.  Skin:    General: Skin is warm and dry.  Neurological:     Mental Status: She is alert and oriented to person, place, and time.         Assessment & Plan:   Problem List Items Addressed This Visit       Unprioritized   Iron deficiency anemia due to chronic blood loss   Continue iron repletion.      Relevant Medications   tranexamic acid (LYSTEDA) 650 MG TABS tablet   Abnormal uterine bleeding (AUB) - Primary    For hysterectomy, but will do in the fall due to life needs. Trial of Lysteda.       Relevant Medications   tranexamic acid (LYSTEDA) 650 MG TABS tablet   Other Visit Diagnoses       Screen for STD (sexually transmitted disease)       Relevant Orders   RPR+HBsAg+HCVAb+...   Cervicovaginal ancillary only     Boil       Warm compresses. Abx.   Relevant Medications   doxycycline (VIBRAMYCIN) 100 MG capsule        Return if symptoms worsen or fail to improve.  Granville Layer, MD 01/04/2024 4:02 PM

## 2024-01-05 ENCOUNTER — Encounter: Payer: Self-pay | Admitting: Family Medicine

## 2024-01-05 NOTE — Assessment & Plan Note (Signed)
Continue iron repletion 

## 2024-01-05 NOTE — Assessment & Plan Note (Signed)
 For hysterectomy, but will do in the fall due to life needs. Trial of Lysteda.

## 2024-01-06 LAB — CERVICOVAGINAL ANCILLARY ONLY
Chlamydia: NEGATIVE
Comment: NEGATIVE
Comment: NEGATIVE
Comment: NORMAL
Neisseria Gonorrhea: NEGATIVE
Trichomonas: NEGATIVE

## 2024-01-06 LAB — RPR+HBSAG+HCVAB+...
HIV Screen 4th Generation wRfx: NONREACTIVE
Hep C Virus Ab: NONREACTIVE
Hepatitis B Surface Ag: NEGATIVE
RPR Ser Ql: NONREACTIVE

## 2024-02-03 ENCOUNTER — Ambulatory Visit

## 2024-02-10 ENCOUNTER — Ambulatory Visit
Admission: RE | Admit: 2024-02-10 | Discharge: 2024-02-10 | Disposition: A | Discharge status: Home / Self Care | Source: Ambulatory Visit | Attending: Internal Medicine | Admitting: Internal Medicine

## 2024-02-10 DIAGNOSIS — Z1231 Encounter for screening mammogram for malignant neoplasm of breast: Secondary | ICD-10-CM | POA: Diagnosis not present

## 2024-02-15 ENCOUNTER — Ambulatory Visit: Payer: Self-pay | Admitting: Internal Medicine

## 2024-02-21 ENCOUNTER — Ambulatory Visit: Admitting: Obstetrics and Gynecology

## 2024-03-05 ENCOUNTER — Other Ambulatory Visit: Payer: Self-pay

## 2024-03-05 ENCOUNTER — Ambulatory Visit
Admission: RE | Admit: 2024-03-05 | Discharge: 2024-03-05 | Disposition: A | Discharge status: Home / Self Care | Payer: Self-pay | Attending: Physician Assistant | Admitting: Physician Assistant

## 2024-03-05 ENCOUNTER — Encounter: Payer: Self-pay | Admitting: Oncology

## 2024-03-05 VITALS — BP 108/80 | HR 86 | Temp 97.9°F | Resp 18 | Ht 67.0 in | Wt 130.0 lb

## 2024-03-05 DIAGNOSIS — R059 Cough, unspecified: Secondary | ICD-10-CM | POA: Diagnosis present

## 2024-03-05 DIAGNOSIS — R0989 Other specified symptoms and signs involving the circulatory and respiratory systems: Secondary | ICD-10-CM | POA: Diagnosis not present

## 2024-03-05 DIAGNOSIS — H9209 Otalgia, unspecified ear: Secondary | ICD-10-CM | POA: Diagnosis not present

## 2024-03-05 DIAGNOSIS — R509 Fever, unspecified: Secondary | ICD-10-CM | POA: Diagnosis not present

## 2024-03-05 DIAGNOSIS — R112 Nausea with vomiting, unspecified: Secondary | ICD-10-CM | POA: Insufficient documentation

## 2024-03-05 DIAGNOSIS — N898 Other specified noninflammatory disorders of vagina: Secondary | ICD-10-CM | POA: Insufficient documentation

## 2024-03-05 DIAGNOSIS — R0981 Nasal congestion: Secondary | ICD-10-CM | POA: Diagnosis not present

## 2024-03-05 LAB — POCT URINE DIPSTICK
Bilirubin, UA: NEGATIVE
Blood, UA: NEGATIVE
Glucose, UA: NEGATIVE mg/dL
Nitrite, UA: NEGATIVE
POC PROTEIN,UA: 100 — AB
Spec Grav, UA: 1.025 (ref 1.010–1.025)
Urobilinogen, UA: 0.2 U/dL
pH, UA: 6 (ref 5.0–8.0)

## 2024-03-05 LAB — POC COVID19/FLU A&B COMBO
Covid Antigen, POC: NEGATIVE
Influenza A Antigen, POC: NEGATIVE
Influenza B Antigen, POC: NEGATIVE

## 2024-03-05 MED ORDER — PROMETHAZINE-DM 6.25-15 MG/5ML PO SYRP: 5.0000 mL | ORAL_SOLUTION | Freq: Four times a day (QID) | ORAL | 0 refills | Status: DC | PRN

## 2024-03-05 NOTE — Discharge Instructions (Signed)
 Based on your described symptoms and the duration of symptoms it is likely that you have a viral upper respiratory infection (often called a "cold")  Symptoms can last for 3-10 days with lingering cough and intermittent symptoms lasting weeks after that.  The goal of treatment at this time is to reduce your symptoms and discomfort   I recommend using Robitussin and Mucinex (regular formulations, nothing with decongestants or DM)  You can also use Tylenol for body aches and fever reduction I also recommend adding an antihistamine to your daily regimen This includes medications like Claritin, Allegra, Zyrtec- the generics of these work very well and are usually less expensive I recommend using Flonase nasal spray - 2 puffs twice per day to help with your nasal congestion The antihistamines and Flonase can take a few weeks to provide significant relief from allergy symptoms but should start to provide some benefit soon. You can use a humidifier at night to help with preventing nasal dryness and irritation   If your symptoms are not improving or seem to be getting worse over the next 5 to 7 days you can always return here to urgent care or you can follow-up with your primary care provider for ongoing management Go to the ER if you begin to have more serious symptoms such as shortness of breath, trouble breathing, loss of consciousness, swelling around the eyes, high fever, severe lasting headaches, vision changes or neck pain/stiffness.

## 2024-03-05 NOTE — ED Triage Notes (Signed)
 Pt presents with complaints of intermittent fevers x 1 week. Managed at home with OTC Ibuprofen . Fevers are accompanied with cough, nasal congestion, headaches, generalized body aches, diarrhea, and bilateral ear pain. The following symptoms have been ongoing for one week. Currently rates ear pain a 10/10. Pt states there is ringing in the ears.   Pt also reporting vaginal discharge x 3 days. Yellow, thick consistency. No pain. Does not notice a foul odor. Denies itching or burning.

## 2024-03-05 NOTE — ED Provider Notes (Signed)
 [facility]    CSN: [id number] Arrival date & time: 03/05/24  0848      History   Chief Complaint Chief Complaint  Patient presents with   Fever    Flu like symptoms - Entered by patient   Vaginal Discharge   Otalgia    HPI Haley Hogan is a 38 y.o. female.   HPI  Pt presents today with concerns for coughing, nasal congestion, body aches, ear pain that has been ongoing since Sunday night She states she has also been having vaginal discharge for about 2-3 days  She denies using tampons and denies concerns for source of intravaginal infection leading to TSS  She reports nausea and states she does have some vomiting from the coughing  She reports coughing is productive  Interventions: none- she states in the past when she tried taking Nyquil or Mucinex she started having elevated BP and felt sluggish  She has tried taking Ibuprofen  and tea    Past Medical History:  Diagnosis Date   Abnormal Pap smear of cervix    Anemia    Fibroids    Hypertension     Patient Active Problem List   Diagnosis Date Noted   Macrocytosis 12/28/2023   Alcohol abuse 12/28/2023   Thrombocytosis 09/27/2023   Vitamin D  deficiency 06/30/2023   Dysplasia of cervix, 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/25/2023   Folic acid  deficiency 06/21/2023   Abnormal uterine bleeding (AUB) 06/21/2023   Pre-diabetes 06/21/2023   Hyperkalemia 06/21/2023   Trichomoniasis 06/21/2023   Iron deficiency anemia due to chronic blood loss 02/16/2023   Fibroids 02/16/2023    Past Surgical History:  Procedure Laterality Date   COLPOSCOPY W/ BIOPSY / CURETTAGE  07/29/2023   ENDOMETRIAL BIOPSY  06/21/2023   LEEP     MANDIBLE FRACTURE SURGERY     SHOULDER ARTHROSCOPY      OB History     Gravida  5   Para  1   Term  1   Preterm      AB  4   Living  1      SAB  4   IAB      Ectopic      Multiple      Live Births           Obstetric Comments  Vaginal Birth x 1          Home  Medications    Prior to Admission medications   Medication Sig Start Date End Date Taking? Authorizing Provider  promethazine -dextromethorphan (PROMETHAZINE -DM) 6.25-15 MG/5ML syrup Take 5 mLs by mouth 4 (four) times daily as needed. 03/05/24  Yes Davyn Elsasser E, PA-C  adapalene  (DIFFERIN ) 0.1 % cream APPLY TOPICALLY AT BEDTIME 09/30/23   Billy Knee, FNP  Cholecalciferol  50 MCG (2000 UT) TBDP One tablet daily. 06/21/23   Izell Harari, MD  cyclobenzaprine  (FLEXERIL ) 10 MG tablet Take 1 tablet (10 mg total) by mouth 3 (three) times daily as needed for muscle spasms. 10/21/23   Billy Knee, FNP  cyproheptadine  (PERIACTIN ) 4 MG tablet Take 1 tablet (4 mg total) by mouth 2 (two) times daily. 06/30/23   Billy Knee, FNP  doxycycline  (VIBRAMYCIN ) 100 MG capsule Take 1 capsule (100 mg total) by mouth 2 (two) times daily. 01/04/24   Fredirick Glenys RAMAN, MD  ferrous sulfate  325 (65 FE) MG tablet Take 1 tablet (325 mg total) by mouth daily. 12/28/23   Pasam, Chinita, MD  folic acid  (FOLVITE ) 1 MG tablet Take 1  tablet (1 mg total) by mouth daily. 06/29/23   Pasam, Chinita, MD  montelukast  (SINGULAIR ) 10 MG tablet Take 1 tablet (10 mg total) by mouth at bedtime. 10/19/23   Billy Knee, FNP  mupirocin  ointment (BACTROBAN ) 2 % Apply 1 Application topically 2 (two) times daily as needed. 10/19/23   Billy Knee, FNP  naproxen  (NAPROSYN ) 500 MG tablet Take 1 tablet (500 mg total) by mouth 2 (two) times daily as needed. 10/19/23   Billy Knee, FNP  tranexamic acid  (LYSTEDA ) 650 MG TABS tablet Take 2 tablets (1,300 mg total) by mouth 3 (three) times daily. 01/04/24   Fredirick Glenys RAMAN, MD  triamcinolone  cream (KENALOG ) 0.1 % Apply 1 Application topically 2 (two) times daily. 10/19/23   Billy Knee, FNP    Family History Family History  Problem Relation Age of Onset   Cancer Maternal Grandmother     Social History Social History   Tobacco Use   Smoking status: Some Days    Types: Cigarettes, Cigars   Smokeless  tobacco: Never  Vaping Use   Vaping status: Never Used  Substance Use Topics   Alcohol use: Yes    Comment: 8 drinks per week   Drug use: Yes    Types: Marijuana     Allergies   Cat dander and Penicillins   Review of Systems Review of Systems  Constitutional:  Positive for chills and fever (last fever was 100 yesterday).  HENT:  Positive for congestion, ear pain, sinus pressure and sinus pain.   Respiratory:  Positive for cough. Negative for shortness of breath and wheezing.   Gastrointestinal:  Positive for diarrhea and nausea. Negative for vomiting.  Genitourinary:  Positive for vaginal discharge.  Musculoskeletal:  Positive for myalgias.  Neurological:  Positive for headaches.     Physical Exam Triage Vital Signs ED Triage Vitals  Encounter Vitals Group     BP 03/05/24 0911 108/80     Girls Systolic BP Percentile --      Girls Diastolic BP Percentile --      Boys Systolic BP Percentile --      Boys Diastolic BP Percentile --      Pulse Rate 03/05/24 0911 86     Resp 03/05/24 0911 18     Temp 03/05/24 0911 97.9 F (36.6 C)     Temp Source 03/05/24 0911 Oral     SpO2 03/05/24 0911 98 %     Weight 03/05/24 0915 130 lb (59 kg)     Height 03/05/24 0915 5' 7 (1.702 m)     Head Circumference --      Peak Flow --      Pain Score 03/05/24 0911 10     Pain Loc --      Pain Education --      Exclude from Growth Chart --    No data found.  Updated Vital Signs BP 108/80 (BP Location: Right Arm)   Pulse 86   Temp 97.9 F (36.6 C) (Oral)   Resp 18   Ht 5' 7 (1.702 m)   Wt 130 lb (59 kg)   LMP 02/15/2024 (Approximate)   SpO2 98%   BMI 20.36 kg/m   Visual Acuity Right Eye Distance:   Left Eye Distance:   Bilateral Distance:    Right Eye Near:   Left Eye Near:    Bilateral Near:     Physical Exam Vitals reviewed.  Constitutional:      General: She is awake. She is not  in acute distress.    Appearance: Normal appearance. She is well-developed and  well-groomed. She is not ill-appearing or toxic-appearing.  HENT:     Head: Normocephalic and atraumatic.     Right Ear: Hearing, tympanic membrane and ear canal normal.     Left Ear: Hearing, tympanic membrane and ear canal normal.     Mouth/Throat:     Lips: Pink.     Mouth: Mucous membranes are moist.     Pharynx: Oropharynx is clear. Uvula midline. No pharyngeal swelling, oropharyngeal exudate, posterior oropharyngeal erythema, uvula swelling or postnasal drip.  Cardiovascular:     Rate and Rhythm: Normal rate and regular rhythm.     Pulses: Normal pulses.          Radial pulses are 2+ on the right side and 2+ on the left side.     Heart sounds: Normal heart sounds. No murmur heard.    No friction rub. No gallop.  Pulmonary:     Effort: Pulmonary effort is normal.     Breath sounds: Normal breath sounds. No decreased air movement. No decreased breath sounds, wheezing, rhonchi or rales.  Musculoskeletal:     Cervical back: Normal range of motion and neck supple.  Lymphadenopathy:     Head:     Right side of head: No submental, submandibular or preauricular adenopathy.     Left side of head: No submental, submandibular or preauricular adenopathy.     Cervical:     Right cervical: No superficial cervical adenopathy.    Left cervical: No superficial cervical adenopathy.     Upper Body:     Right upper body: No supraclavicular adenopathy.     Left upper body: No supraclavicular adenopathy.  Neurological:     Mental Status: She is alert.  Psychiatric:        Behavior: Behavior is cooperative.      UC Treatments / Results  Labs (all labs ordered are listed, but only abnormal results are displayed) Labs Reviewed  POCT URINE DIPSTICK - Abnormal; Notable for the following components:      Result Value   Color, UA orange (*)    Clarity, UA hazy (*)    Ketones, POC UA trace (5) (*)    POC PROTEIN,UA =100 (*)    Leukocytes, UA Trace (*)    All other components within normal  limits  URINE CULTURE  POC COVID19/FLU A&B COMBO  CERVICOVAGINAL ANCILLARY ONLY    EKG   Radiology No results found.  Procedures Procedures (including critical care time)  Medications Ordered in UC Medications - No data to display  Initial Impression / Assessment and Plan / UC Course  I have reviewed the triage vital signs and the nursing notes.  Pertinent labs & imaging results that were available during my care of the patient were reviewed by me and considered in my medical decision making (see chart for details).      Final Clinical Impressions(s) / UC Diagnoses   Final diagnoses:  Symptoms of upper respiratory infection (URI)  Vaginal discharge   Patient presents today with concerns of coughing, nasal congestion headaches, body aches, ear pain has been ongoing for about a week.  She reports that she has tried taking ibuprofen  and warm tea but this has not provided much relief.  Patient is tested negative for flu and COVID in clinic today. Physical exam and vitals are overall reassuring in clinic.  Reviewed with patient that her symptoms are likely viral in nature  so we will start supportive measures.  Will send promethazine -dextromethorphan for coughing and nausea.  Reviewed that this medication can cause drowsiness and sedation so she should not take it if she needs to remain alert or drive.  Recommend trying Flonase, antihistamine, nasal flushes and rinses to further assist with symptoms since she has had reactions to Mucinex in the past.  If symptoms or not improving or seem to be worsening recommend follow-up or return to urgent care. Patient also expresses concerns for vaginal discharge for the past 3 days.  She denies using tampons for menses and denies concerns for TSS given systemic URI symptoms.  Urine dip was notable for trace leukocytes but I suspect this is likely secondary from vulvovaginal process.  Will send urine culture for definitive rule out.  Cervicovaginal  swab collected for gonorrhea, chlamydia, trichomonas, yeast, BV testing.  Results to dictate further management.  Follow-up as needed for progressing or persistent symptoms    Discharge Instructions      Based on your described symptoms and the duration of symptoms it is likely that you have a viral upper respiratory infection (often called a cold)  Symptoms can last for 3-10 days with lingering cough and intermittent symptoms lasting weeks after that.  The goal of treatment at this time is to reduce your symptoms and discomfort   I recommend using Robitussin and Mucinex (regular formulations, nothing with decongestants or DM)  You can also use Tylenol  for body aches and fever reduction I also recommend adding an antihistamine to your daily regimen This includes medications like Claritin, Allegra, Zyrtec- the generics of these work very well and are usually less expensive I recommend using Flonase nasal spray - 2 puffs twice per day to help with your nasal congestion The antihistamines and Flonase can take a few weeks to provide significant relief from allergy symptoms but should start to provide some benefit soon. You can use a humidifier at night to help with preventing nasal dryness and irritation   If your symptoms are not improving or seem to be getting worse over the next 5 to 7 days you can always return here to urgent care or you can follow-up with your primary care provider for ongoing management Go to the ER if you begin to have more serious symptoms such as shortness of breath, trouble breathing, loss of consciousness, swelling around the eyes, high fever, severe lasting headaches, vision changes or neck pain/stiffness.       ED Prescriptions     Medication Sig Dispense Auth. Provider   promethazine -dextromethorphan (PROMETHAZINE -DM) 6.25-15 MG/5ML syrup Take 5 mLs by mouth 4 (four) times daily as needed. 118 mL Jamise Pentland E, PA-C      PDMP not reviewed this  encounter.   Marylene Rocky BRAVO, PA-C 03/05/24 1123

## 2024-03-06 ENCOUNTER — Ambulatory Visit (HOSPITAL_COMMUNITY): Payer: Self-pay

## 2024-03-06 LAB — CERVICOVAGINAL ANCILLARY ONLY
Bacterial Vaginitis (gardnerella): POSITIVE — AB
Candida Glabrata: NEGATIVE
Candida Vaginitis: NEGATIVE
Chlamydia: NEGATIVE
Comment: NEGATIVE
Comment: NEGATIVE
Comment: NEGATIVE
Comment: NEGATIVE
Comment: NEGATIVE
Comment: NORMAL
Neisseria Gonorrhea: NEGATIVE
Trichomonas: NEGATIVE

## 2024-03-06 LAB — URINE CULTURE: Culture: <10000 — AB

## 2024-03-07 MED ORDER — METRONIDAZOLE 500 MG PO TABS: 500.0000 mg | ORAL_TABLET | Freq: Two times a day (BID) | ORAL | 0 refills | Status: AC

## 2024-04-03 ENCOUNTER — Encounter: Payer: Self-pay | Admitting: Oncology

## 2024-04-03 ENCOUNTER — Inpatient Hospital Stay: Attending: Oncology

## 2024-04-03 ENCOUNTER — Inpatient Hospital Stay: Admitting: Oncology

## 2024-04-03 VITALS — BP 132/93 | HR 87 | Temp 97.6°F | Resp 18 | Ht 67.0 in | Wt 120.6 lb

## 2024-04-03 DIAGNOSIS — E538 Deficiency of other specified B group vitamins: Secondary | ICD-10-CM | POA: Insufficient documentation

## 2024-04-03 DIAGNOSIS — N870 Mild cervical dysplasia: Secondary | ICD-10-CM | POA: Diagnosis not present

## 2024-04-03 DIAGNOSIS — D7589 Other specified diseases of blood and blood-forming organs: Secondary | ICD-10-CM

## 2024-04-03 DIAGNOSIS — F1010 Alcohol abuse, uncomplicated: Secondary | ICD-10-CM | POA: Diagnosis not present

## 2024-04-03 DIAGNOSIS — N920 Excessive and frequent menstruation with regular cycle: Secondary | ICD-10-CM | POA: Insufficient documentation

## 2024-04-03 DIAGNOSIS — D500 Iron deficiency anemia secondary to blood loss (chronic): Secondary | ICD-10-CM

## 2024-04-03 LAB — CBC WITH DIFFERENTIAL (CANCER CENTER ONLY)
Abs Immature Granulocytes: 0.01 K/uL (ref 0.00–0.07)
Basophils Absolute: 0 K/uL (ref 0.0–0.1)
Basophils Relative: 0 %
Eosinophils Absolute: 0 K/uL (ref 0.0–0.5)
Eosinophils Relative: 0 %
HCT: 38.2 % (ref 36.0–46.0)
Hemoglobin: 12.9 g/dL (ref 12.0–15.0)
Immature Granulocytes: 0 %
Lymphocytes Relative: 19 %
Lymphs Abs: 1 K/uL (ref 0.7–4.0)
MCH: 34.8 pg — ABNORMAL HIGH (ref 26.0–34.0)
MCHC: 33.8 g/dL (ref 30.0–36.0)
MCV: 103 fL — ABNORMAL HIGH (ref 80.0–100.0)
Monocytes Absolute: 0.5 K/uL (ref 0.1–1.0)
Monocytes Relative: 10 %
Neutro Abs: 3.6 K/uL (ref 1.7–7.7)
Neutrophils Relative %: 71 %
Platelet Count: 229 K/uL (ref 150–400)
RBC: 3.71 MIL/uL — ABNORMAL LOW (ref 3.87–5.11)
RDW: 14.8 % (ref 11.5–15.5)
WBC Count: 5.1 K/uL (ref 4.0–10.5)
nRBC: 0 % (ref 0.0–0.2)

## 2024-04-03 LAB — FOLATE: Folate: 6.1 ng/mL (ref >5.9)

## 2024-04-03 LAB — IRON AND TIBC
Iron: 244 ug/dL — ABNORMAL HIGH (ref 28–170)
Saturation Ratios: 58 % — ABNORMAL HIGH (ref 10.4–31.8)
TIBC: 420 ug/dL (ref 250–450)
UIBC: 176 ug/dL

## 2024-04-03 LAB — FERRITIN: Ferritin: 50 ng/mL (ref 11–307)

## 2024-04-03 LAB — VITAMIN B12: Vitamin B-12: 590 pg/mL (ref 180–914)

## 2024-04-03 MED ORDER — FOLIC ACID 1 MG PO TABS: 1.0000 mg | ORAL_TABLET | Freq: Every day | ORAL | 3 refills | Status: AC

## 2024-04-03 MED ORDER — FERROUS SULFATE 325 (65 FE) MG PO TABS: 325.0000 mg | ORAL_TABLET | Freq: Every day | ORAL | 3 refills | Status: AC

## 2024-04-03 NOTE — Assessment & Plan Note (Signed)
 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 Discussed rescheduling when support is available.  - Coordinate with OBGYN for rescheduling hysterectomy when appropriate support is available

## 2024-04-03 NOTE — Progress Notes (Signed)
 [facility]  HEMATOLOGY CLINIC PROGRESS NOTE  PATIENT NAME: Haley Hogan   MR#: [id number] DOB: March 04, 1986  Patient Care Team: Billy Knee, FNP as PCP - General (Internal Medicine) Izell Harari, MD as Consulting Physician (Obstetrics and Gynecology)  Date of visit: 04/03/2024   ASSESSMENT & PLAN:   Haley Hogan is a 38 y.o. 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    Iron deficiency anemia due to chronic blood loss - Presented with anemia with hemoglobin of 8.3, likely secondary to heavy menstrual bleeding from fibroids and low iron stores. Patient has symptoms of pica (ice craving).  - Iron labs at her OB/GYN office from 06/17/2023 reviewed.  Ferritin 6, iron saturation decreased at 2%, iron decreased at 11, iron binding capacity increased at 727.  Iron labs were indicative of severe iron deficiency.  Since she could not tolerate oral iron, we proceeded with IV iron in December 2024 using Feraheme x 2 doses, 1 week apart.   She was also found to have folate deficiency and has been taking folic acid  1 mg daily.  Significant symptom improvement after receiving IV iron and starting folic acid . Previous pica and cold intolerance resolved. Hemoglobin increased from 8 to 11.9 g/dL, platelets normalized from 724,000 to 423,000.   -She was agreeable to starting oral iron supplements again when she started this from March 2025.  Prescription sent to her pharmacy.  She was advised to take iron supplements with vitamin C to enhance absorption.  Labs today showed improved hemoglobin of 12.9.  Ferritin normal at 50.  Iron studies pending.  Currently no indication for IV iron.  - Continue folic acid   - Consider alternate-day iron dosing if constipation occurs  -Plan to reevaluate her in 3 months  with repeat labs including iron studies.  Folic acid  deficiency Low levels of vitamin D  and folic acid  noted. -Previously sent prescription for folic acid  1 mg daily. -Advised patient to consider over-the-counter multivitamin containing both Vitamin D  and B vitamins, depending on cost-effectiveness.   Macrocytosis Macrocytosis with increased red blood cell size. Alcohol consumption is a possible contributing factor. Folic acid  supplementation is ongoing. - Continue folic acid  supplementation. - Advise limiting alcohol consumption to reduce macrocytosis risk.  Alcohol abuse Consumes wine every day and brandy on weekends, averaging 8-10 drinks per week. Alcohol consumption may contribute to macrocytosis and poses risks for liver damage, cirrhosis, and various cancers including liver, stomach, and breast cancer. - Advise limiting alcohol consumption to one drink per day to reduce health risks.  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 Discussed rescheduling when support is available.  - Coordinate with OBGYN for rescheduling hysterectomy when appropriate support is available   I spent a total of 27 minutes during this encounter with the patient including review of chart and various tests results, discussions about plan of care and coordination of care plan.  I reviewed lab results and outside records for this visit and discussed relevant results with the patient. Diagnosis, plan of care and treatment options were also discussed in detail with the patient. Opportunity provided to ask questions and answers provided to her apparent satisfaction. Provided instructions to call our clinic with any problems,  questions or concerns prior to return visit. I recommended to continue follow-up with PCP and sub-specialists. She verbalized understanding and  agreed with the plan. No barriers to learning was detected.  Chinita Patten, MD  04/03/2024 2:23 PM  [facility] A DEPT OF [facility] [address] [place] [address] Dept: [phone] Dept Fax: [phone]   CHIEF COMPLAINT/ REASON FOR VISIT:  Follow-up for iron deficiency anemia, related to menorrhagia.  Also has folate deficiency.  INTERVAL HISTORY:  Discussed the use of AI scribe software for clinical note transcription with the patient, who gave verbal consent to proceed.  History of Present Illness  Haley Hogan is a 38 year old female who presents for follow-up regarding her anemia and iron supplementation.  She has not been taking her oral iron supplements for the past two weeks. She requests that her iron medication, ferrous sulfate , and folic acid  be sent to her pharmacy, [facility].  She experiences symptoms suggestive of bacterial vaginosis and requests a prescription for doxycycline  or metronidazole . The last prescription for doxycycline  was in June. She prefers to avoid urgent care visits and plans to discuss this with her primary care provider, whom she is scheduled to see on the 23rd.  She reports experiencing hot flashes, describing episodes of sweating even in cool environments. She inquires about hormone level testing, questioning whether it should be addressed by her primary care provider or during her hematology visits. She associates these symptoms with her heavy menstrual bleeding, which she believes contributes to her anemia.  Her family history includes significant cancer concerns: her maternal grandfather passed away from blood cancer, her maternal grandmother from liver cancer, and her mother is currently prediabetic and takes metformin. She expresses concern about her own cancer risk, having recently undergone a mammogram with normal results.  She discusses a planned  hysterectomy, which has been delayed due to logistical challenges. She requires assistance during recovery, preferring family support over home health aides. Her family, who could assist, resides in [place] , which complicates her post-operative care plans.  In terms of social history, she consumes alcohol, specifically wine and occasionally brandy. She estimates drinking three bottles of wine per week and sometimes brandy on weekends.    SUMMARY OF HEMATOLOGIC HISTORY:  She established care with OB/GYN physician Dr. Izell on 06/17/2023.  She has a history of uterine fibroids with secondary mass effects including difficulty voiding and constipation, in addition to heavy and painful monthly menses. Patient has been anemic at multiple ED visits with hemoglobin as low as 7 as a consequence of heavy menstrual bleeding but is not currently taking any supplements. Moved from [place]  in 2021 and has full workup including ultrasounds and MRI but has been lost to OBGYN follow up since then.    On 06/17/2023, labs showed hemoglobin of 8.3, hematocrit 30, MCV 79.  White count was 8700 with normal differential, platelet count 774,000.  Ferritin low at 6, iron saturation decreased to 2%, iron binding capacity increased at 727, iron decreased at 11, all indicated of iron deficiency state.  Folic acid  was also decreased at 2.4 ng/mL.  Hepatitis, HIV testing, RPR came back negative.  TSH was within normal limits.  Vitamin D  low at 16.5 ng/mL.  Testing was positive for trichomonas and candida vaginitis.  On Pap smear, she was found to have high risk HPV and low-grade squamous intraepithelial lesion (LSIL).   She has been prescribed iron  supplements but has stopped taking them due to constipation. She also has a history of low vitamin D  levels. She has a craving for ice, which can be a symptom of iron deficiency anemia. She has not been experiencing chest pain or shortness of breath. She is concerned about a family  history of cancer.   She denies recent chest pain on exertion, shortness of breath on minimal exertion, pre-syncopal episodes, or palpitations.   She had not noticed any recent bleeding such as epistaxis, hematuria or hematochezia   She had no prior history or diagnosis of cancer.   Since she could not tolerate oral iron, we proceeded with IV iron in December 2024 using Feraheme x 2 doses, 1 week apart.   She was also found to have folate deficiency and has been taking folic acid  1 mg daily.   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 Dr. Izell is planning to proceed with hysterectomy for her.   I have reviewed the past medical history, past surgical history, social history and family history with the patient and they are unchanged from previous note.  ALLERGIES: She is allergic to bee venom, cat dander, and penicillins.  MEDICATIONS:  Current Outpatient Medications  Medication Sig Dispense Refill   adapalene  (DIFFERIN ) 0.1 % cream APPLY TOPICALLY AT BEDTIME 45 g 0   cyclobenzaprine  (FLEXERIL ) 10 MG tablet Take 1 tablet (10 mg total) by mouth 3 (three) times daily as needed for muscle spasms. 30 tablet 0   cyproheptadine  (PERIACTIN ) 4 MG tablet Take 1 tablet (4 mg total) by mouth 2 (two) times daily. 180 tablet 1   montelukast  (SINGULAIR ) 10 MG tablet Take 1 tablet (10 mg total) by mouth at bedtime. 90 tablet 1   mupirocin  ointment (BACTROBAN ) 2 % Apply 1 Application topically 2 (two) times daily as needed. 22 g 1   naproxen  (NAPROSYN ) 500 MG tablet Take 1 tablet (500 mg total) by mouth 2 (two) times daily as needed. 30 tablet 0   triamcinolone  cream (KENALOG ) 0.1 % Apply 1 Application topically 2 (two) times daily. 45 g 1   ferrous sulfate  325 (65 FE) MG tablet Take 1 tablet (325 mg total) by mouth daily. 90 tablet 3   folic acid  (FOLVITE ) 1 MG tablet Take 1 tablet (1 mg total) by mouth daily. 90 tablet 3   tranexamic acid  (LYSTEDA ) 650 MG TABS tablet Take  2 tablets (1,300 mg total) by mouth 3 (three) times daily. (Patient not taking: Reported on 04/03/2024) 30 tablet 2   No current facility-administered medications for this visit.     REVIEW OF SYSTEMS:    ROS  All other pertinent systems were reviewed with the patient and are negative.  PHYSICAL EXAMINATION:   Onc Performance Status - 04/03/24 1400       ECOG Perf Status   ECOG Perf Status Fully active, able to carry on all pre-disease performance without restriction      KPS SCALE   KPS % SCORE Normal, no compliants, no evidence of disease            Vitals:   04/03/24 1200  BP: (!) 132/93  Pulse: 87  Resp: 18  Temp: 97.6 F (36.4 C)  SpO2: 100%   Filed Weights   04/03/24 1200  Weight: 120 lb 9.6 oz (54.7 kg)    Physical Exam Constitutional:      General: She is not in acute distress.    Appearance: Normal appearance.  HENT:  Head: Normocephalic and atraumatic.  Eyes:     General: No scleral icterus.    Conjunctiva/sclera: Conjunctivae normal.  Cardiovascular:     Rate and Rhythm: Normal rate and regular rhythm.     Heart sounds: Normal heart sounds.  Pulmonary:     Effort: Pulmonary effort is normal.     Breath sounds: Normal breath sounds.  Abdominal:     General: There is no distension.  Musculoskeletal:     Right lower leg: No edema.     Left lower leg: No edema.  Neurological:     General: No focal deficit present.     Mental Status: She is alert and oriented to person, place, and time.  Psychiatric:        Mood and Affect: Mood normal.        Behavior: Behavior normal.        Thought Content: Thought content normal.     LABORATORY DATA:   I have reviewed the data as listed.  Results for orders placed or performed in visit on 04/03/24  Ferritin  Result Value Ref Range   Ferritin 50 11 - 307 ng/mL  CBC with Differential (Cancer Center Only)  Result Value Ref Range   WBC Count 5.1 4.0 - 10.5 K/uL   RBC 3.71 (L) 3.87 - 5.11 MIL/uL    Hemoglobin 12.9 12.0 - 15.0 g/dL   HCT 61.7 63.9 - 53.9 %   MCV 103.0 (H) 80.0 - 100.0 fL   MCH 34.8 (H) 26.0 - 34.0 pg   MCHC 33.8 30.0 - 36.0 g/dL   RDW 85.1 88.4 - 84.4 %   Platelet Count 229 150 - 400 K/uL   nRBC 0.0 0.0 - 0.2 %   Neutrophils Relative % 71 %   Neutro Abs 3.6 1.7 - 7.7 K/uL   Lymphocytes Relative 19 %   Lymphs Abs 1.0 0.7 - 4.0 K/uL   Monocytes Relative 10 %   Monocytes Absolute 0.5 0.1 - 1.0 K/uL   Eosinophils Relative 0 %   Eosinophils Absolute 0.0 0.0 - 0.5 K/uL   Basophils Relative 0 %   Basophils Absolute 0.0 0.0 - 0.1 K/uL   Immature Granulocytes 0 %   Abs Immature Granulocytes 0.01 0.00 - 0.07 K/uL    RADIOGRAPHIC STUDIES: No recent pertinent imaging studies available to review.  Orders Placed This Encounter  Procedures   CBC with Differential (Cancer Center Only)    Standing Status:   Future    Expected Date:   07/03/2024    Expiration Date:   10/01/2024   Iron and TIBC    Standing Status:   Future    Expected Date:   07/03/2024    Expiration Date:   10/01/2024   Ferritin    Standing Status:   Future    Expected Date:   07/03/2024    Expiration Date:   10/01/2024   Folate    Standing Status:   Future    Expected Date:   07/03/2024    Expiration Date:   10/01/2024     Future Appointments  Date Time Provider Department Center  04/20/2024  3:20 PM Billy Knee, FNP [facility]     This document was completed utilizing speech recognition software. Grammatical errors, random word insertions, pronoun errors, and incomplete sentences are an occasional consequence of this system due to software limitations, ambient noise, and hardware issues. Any formal questions or concerns about the content, text or information contained within the body of  this dictation should be directly addressed to the provider for clarification.

## 2024-04-03 NOTE — Assessment & Plan Note (Addendum)
 Consumes wine every day and brandy on weekends, averaging 8-10 drinks per week. Alcohol consumption may contribute to macrocytosis and poses risks for liver damage, cirrhosis, and various cancers including liver, stomach, and breast cancer. - Advise limiting alcohol consumption to one drink per day to reduce health risks.

## 2024-04-03 NOTE — Assessment & Plan Note (Signed)
 Macrocytosis with increased red blood cell size. Alcohol consumption is a possible contributing factor. Folic acid  supplementation is ongoing. - Continue folic acid  supplementation. - Advise limiting alcohol consumption to reduce macrocytosis risk.

## 2024-04-03 NOTE — Assessment & Plan Note (Signed)
-   Presented with anemia with hemoglobin of 8.3, likely secondary to heavy menstrual bleeding from fibroids and low iron stores. Patient has symptoms of pica (ice craving).  - Iron labs at her OB/GYN office from 06/17/2023 reviewed.  Ferritin 6, iron saturation decreased at 2%, iron decreased at 11, iron binding capacity increased at 727.  Iron labs were indicative of severe iron deficiency.  Since she could not tolerate oral iron, we proceeded with IV iron in December 2024 using Feraheme x 2 doses, 1 week apart.   She was also found to have folate deficiency and has been taking folic acid  1 mg daily.  Significant symptom improvement after receiving IV iron and starting folic acid . Previous pica and cold intolerance resolved. Hemoglobin increased from 8 to 11.9 g/dL, platelets normalized from 724,000 to 423,000.   -She was agreeable to starting oral iron supplements again when she started this from March 2025.  Prescription sent to her pharmacy.  She was advised to take iron supplements with vitamin C to enhance absorption.  Labs today showed improved hemoglobin of 12.9.  Ferritin normal at 50.  Iron studies pending.  Currently no indication for IV iron.  - Continue folic acid   - Consider alternate-day iron dosing if constipation occurs  -Plan to reevaluate her in 3 months with repeat labs including iron studies.

## 2024-04-03 NOTE — Assessment & Plan Note (Signed)
 Low levels of vitamin D and folic acid noted. -Previously sent prescription for folic acid 1 mg daily. -Advised patient to consider over-the-counter multivitamin containing both Vitamin D and B vitamins, depending on cost-effectiveness.

## 2024-04-15 ENCOUNTER — Ambulatory Visit (HOSPITAL_COMMUNITY)
Admission: EM | Admit: 2024-04-15 | Discharge: 2024-04-15 | Disposition: A | Discharge status: Home / Self Care | Attending: Internal Medicine | Admitting: Internal Medicine

## 2024-04-15 ENCOUNTER — Encounter (HOSPITAL_COMMUNITY): Payer: Self-pay

## 2024-04-15 ENCOUNTER — Emergency Department (HOSPITAL_COMMUNITY)
Admission: EM | Admit: 2024-04-15 | Discharge: 2024-04-15 | Discharge status: Against Medical Advice | Attending: Emergency Medicine | Admitting: Emergency Medicine

## 2024-04-15 ENCOUNTER — Other Ambulatory Visit: Payer: Self-pay

## 2024-04-15 ENCOUNTER — Encounter (HOSPITAL_COMMUNITY): Payer: Self-pay | Admitting: Emergency Medicine

## 2024-04-15 DIAGNOSIS — Z5321 Procedure and treatment not carried out due to patient leaving prior to being seen by health care provider: Secondary | ICD-10-CM | POA: Insufficient documentation

## 2024-04-15 DIAGNOSIS — W228XXA Striking against or struck by other objects, initial encounter: Secondary | ICD-10-CM | POA: Insufficient documentation

## 2024-04-15 DIAGNOSIS — S81811A Laceration without foreign body, right lower leg, initial encounter: Secondary | ICD-10-CM

## 2024-04-15 DIAGNOSIS — Z23 Encounter for immunization: Secondary | ICD-10-CM

## 2024-04-15 MED ORDER — TETANUS-DIPHTH-ACELL PERTUSSIS 5-2.5-18.5 LF-MCG/0.5 IM SUSY
PREFILLED_SYRINGE | INTRAMUSCULAR | Status: AC
  Filled 2024-04-15: qty 0.5

## 2024-04-15 MED ORDER — MUPIROCIN 2 % EX OINT: 1.0000 | TOPICAL_OINTMENT | Freq: Two times a day (BID) | CUTANEOUS | 1 refills | Status: AC

## 2024-04-15 MED ORDER — LIDOCAINE-EPINEPHRINE 1 %-1:100000 IJ SOLN
INTRAMUSCULAR | Status: AC
  Filled 2024-04-15: qty 1

## 2024-04-15 MED ORDER — TETANUS-DIPHTH-ACELL PERTUSSIS 5-2.5-18.5 LF-MCG/0.5 IM SUSY
0.5000 mL | PREFILLED_SYRINGE | Freq: Once | INTRAMUSCULAR | Status: AC
  Administered 2024-04-15: 0.5 mL via INTRAMUSCULAR

## 2024-04-15 MED ORDER — SILVER SULFADIAZINE 1 % EX CREA: 1.0000 | TOPICAL_CREAM | Freq: Every day | CUTANEOUS | 1 refills | Status: AC

## 2024-04-15 NOTE — ED Triage Notes (Signed)
 Pt reports laceration to rt leg. Reports she jumped onto her concrete stairs and hit her shin. 1 1/2 inch laceration. Bleeding controlled.

## 2024-04-15 NOTE — Discharge Instructions (Addendum)
 Laceration repair of the right lower leg.  4 sutures are in place.  The remainder of the wound will have to heal secondarily as there was not enough remaining skin to pull this together.  Recommend applying a small amount of Silvadene  or mupirocin  to the area twice daily and keep covered with a dry dressing.  May use Tylenol  or ibuprofen  for pain.  Have sutures removed in 7 to 10 days.  May come here or go to your primary care provider.  Tetanus is updated today.  Monitor for signs of infection including purulent drainage, redness, increased pain.  Do not submerge the area completely underwater until the sutures are removed.  You may shower and wash the area with soap and water.

## 2024-04-15 NOTE — ED Provider Notes (Signed)
 [facility]    CSN: [id number] Arrival date & time: 04/15/24  1010      History   Chief Complaint Chief Complaint  Patient presents with   Laceration    HPI Haley Hogan is a 38 y.o. female.   38 year old female presents urgent care with complaints of a right leg laceration.  She reports that late last night she was getting out of her car and jumped up on a brick area but missed the step hitting her leg.  She had significant bleeding initially and she wrapped in a towel.  She did go to the emergency room but due to the wait she came here for further evaluation.  She believes that her last tetanus was at least 7 years ago if not longer.  She denies any other injury to the area.   Laceration Associated symptoms: no fever and no rash     Past Medical History:  Diagnosis Date   Abnormal Pap smear of cervix    Anemia    Fibroids    Hypertension     Patient Active Problem List   Diagnosis Date Noted   Macrocytosis 12/28/2023   Alcohol abuse 12/28/2023   Thrombocytosis 09/27/2023   Vitamin D  deficiency 06/30/2023   Dysplasia of cervix, 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/25/2023   Folic acid  deficiency 06/21/2023   Abnormal uterine bleeding (AUB) 06/21/2023   Pre-diabetes 06/21/2023   Hyperkalemia 06/21/2023   Trichomoniasis 06/21/2023   Iron deficiency anemia due to chronic blood loss 02/16/2023   Fibroids 02/16/2023    Past Surgical History:  Procedure Laterality Date   COLPOSCOPY W/ BIOPSY / CURETTAGE  07/29/2023   ENDOMETRIAL BIOPSY  06/21/2023   LEEP     MANDIBLE FRACTURE SURGERY     SHOULDER ARTHROSCOPY      OB History     Gravida  5   Para  1   Term  1   Preterm      AB  4   Living  1      SAB  4   IAB      Ectopic      Multiple      Live Births           Obstetric Comments  Vaginal Birth x 1          Home Medications    Prior to Admission medications   Medication Sig Start Date End Date Taking? Authorizing Provider   mupirocin  ointment (BACTROBAN ) 2 % Apply 1 Application topically 2 (two) times daily. 04/15/24  Yes Druscilla Petsch A, PA-C  silver  sulfADIAZINE  (SILVADENE ) 1 % cream Apply 1 Application topically daily. 04/15/24  Yes Cormick Moss A, PA-C  adapalene  (DIFFERIN ) 0.1 % cream APPLY TOPICALLY AT BEDTIME 09/30/23   Billy Knee, FNP  cyclobenzaprine  (FLEXERIL ) 10 MG tablet Take 1 tablet (10 mg total) by mouth 3 (three) times daily as needed for muscle spasms. 10/21/23   Billy Knee, FNP  cyproheptadine  (PERIACTIN ) 4 MG tablet Take 1 tablet (4 mg total) by mouth 2 (two) times daily. 06/30/23   Billy Knee, FNP  ferrous sulfate  325 (65 FE) MG tablet Take 1 tablet (325 mg total) by mouth daily. 04/03/24   Pasam, Chinita, MD  folic acid  (FOLVITE ) 1 MG tablet Take 1 tablet (1 mg total) by mouth daily. 04/03/24   Pasam, Chinita, MD  montelukast  (SINGULAIR ) 10 MG tablet Take 1 tablet (10 mg total) by mouth at bedtime. 10/19/23   Billy Knee, FNP  naproxen  (  NAPROSYN ) 500 MG tablet Take 1 tablet (500 mg total) by mouth 2 (two) times daily as needed. 10/19/23   Billy Knee, FNP  tranexamic acid  (LYSTEDA ) 650 MG TABS tablet Take 2 tablets (1,300 mg total) by mouth 3 (three) times daily. Patient not taking: Reported on 04/03/2024 01/04/24   Fredirick Glenys RAMAN, MD  triamcinolone  cream (KENALOG ) 0.1 % Apply 1 Application topically 2 (two) times daily. 10/19/23   Billy Knee, FNP    Family History Family History  Problem Relation Age of Onset   Cancer Maternal Grandmother     Social History Social History   Tobacco Use   Smoking status: Some Days    Types: Cigarettes, Cigars   Smokeless tobacco: Never  Vaping Use   Vaping status: Never Used  Substance Use Topics   Alcohol use: Yes    Comment: 8 drinks per week   Drug use: Yes    Types: Marijuana     Allergies   Bee venom, Cat dander, and Penicillins   Review of Systems Review of Systems  Constitutional:  Negative for chills and fever.  HENT:   Negative for ear pain and sore throat.   Eyes:  Negative for pain and visual disturbance.  Respiratory:  Negative for cough and shortness of breath.   Cardiovascular:  Negative for chest pain and palpitations.  Gastrointestinal:  Negative for abdominal pain and vomiting.  Genitourinary:  Negative for dysuria and hematuria.  Musculoskeletal:  Negative for arthralgias and back pain.  Skin:  Positive for wound. Negative for color change and rash.  Neurological:  Negative for seizures and syncope.  All other systems reviewed and are negative.    Physical Exam Triage Vital Signs ED Triage Vitals  Encounter Vitals Group     BP 04/15/24 1056 (!) 141/69     Girls Systolic BP Percentile --      Girls Diastolic BP Percentile --      Boys Systolic BP Percentile --      Boys Diastolic BP Percentile --      Pulse Rate 04/15/24 1056 98     Resp 04/15/24 1056 18     Temp 04/15/24 1056 98.2 F (36.8 C)     Temp Source 04/15/24 1056 Oral     SpO2 04/15/24 1056 98 %     Weight --      Height --      Head Circumference --      Peak Flow --      Pain Score 04/15/24 1055 10     Pain Loc --      Pain Education --      Exclude from Growth Chart --    No data found.  Updated Vital Signs BP (!) 141/69 (BP Location: Left Arm)   Pulse 98   Temp 98.2 F (36.8 C) (Oral)   Resp 18   LMP 03/16/2024 (Approximate)   SpO2 98%   Visual Acuity Right Eye Distance:   Left Eye Distance:   Bilateral Distance:    Right Eye Near:   Left Eye Near:    Bilateral Near:     Physical Exam Vitals and nursing note reviewed.  Constitutional:      General: She is not in acute distress.    Appearance: She is well-developed.  HENT:     Head: Normocephalic and atraumatic.  Eyes:     Conjunctiva/sclera: Conjunctivae normal.  Cardiovascular:     Rate and Rhythm: Normal rate and regular rhythm.  Pulmonary:  Effort: Pulmonary effort is normal. No respiratory distress.  Musculoskeletal:        General:  No swelling.     Cervical back: Neck supple.  Skin:    General: Skin is warm and dry.     Capillary Refill: Capillary refill takes less than 2 seconds.     Comments: Laceration on the right pretibial region with a linear area at the superior aspect and an irregular area that is wider at the inferior aspect with skin gone completely  Neurological:     Mental Status: She is alert.  Psychiatric:        Mood and Affect: Mood normal.      UC Treatments / Results  Labs (all labs ordered are listed, but only abnormal results are displayed) Labs Reviewed - No data to display  EKG   Radiology No results found.  Procedures Laceration Repair  Date/Time: 04/15/2024 11:46 AM  Performed by: Teresa Almarie LABOR, PA-C Authorized by: Teresa Almarie LABOR, PA-C   Consent:    Consent obtained:  Verbal   Consent given by:  Patient   Risks discussed:  Infection, need for additional repair, pain, poor cosmetic result and poor wound healing   Alternatives discussed:  No treatment and delayed treatment Universal protocol:    Procedure explained and questions answered to patient or proxy's satisfaction: yes     Relevant documents present and verified: yes     Site/side marked: yes     Immediately prior to procedure, a time out was called: yes     Patient identity confirmed:  Verbally with patient Anesthesia:    Anesthesia method:  Local infiltration   Local anesthetic:  Lidocaine  1% WITH epi Laceration details:    Location:  Leg   Leg location:  R lower leg   Length (cm):  3   Depth (mm):  4 Pre-procedure details:    Preparation:  Patient was prepped and draped in usual sterile fashion Treatment:    Area cleansed with:  Povidone-iodine   Amount of cleaning:  Standard   Irrigation solution:  Sterile saline   Irrigation method:  Syringe Skin repair:    Repair method:  Sutures   Suture size:  4-0   Suture technique:  Simple interrupted   Number of sutures:  4 Approximation:     Approximation:  Close Repair type:    Repair type:  Simple Post-procedure details:    Dressing:  Bulky dressing   Procedure completion:  Tolerated well, no immediate complications Comments:     Small area in the inferior aspect of the wound remained open as there was no remaining skin to pull together and too much tension was present.  This area will need to heal secondarily.  (including critical care time)  Medications Ordered in UC Medications  Tdap (BOOSTRIX ) injection 0.5 mL (has no administration in time range)    Initial Impression / Assessment and Plan / UC Course  I have reviewed the triage vital signs and the nursing notes.  Pertinent labs & imaging results that were available during my care of the patient were reviewed by me and considered in my medical decision making (see chart for details).     Leg laceration, right, initial encounter   Laceration repair of the right lower leg.  4 sutures are in place.  The remainder of the wound will have to heal secondarily as there was not enough remaining skin to pull this together.  Recommend applying a small amount of Silvadene   or mupirocin  to the area twice daily and keep covered with a dry dressing.  May use Tylenol  or ibuprofen  for pain.  Have sutures removed in 7 to 10 days.  May come here or go to your primary care provider.  Tetanus is updated today.  Monitor for signs of infection including purulent drainage, redness, increased pain.  Do not submerge the area completely underwater until the sutures are removed.  You may shower and wash the area with soap and water.  Final Clinical Impressions(s) / UC Diagnoses   Final diagnoses:  Leg laceration, right, initial encounter     Discharge Instructions      Laceration repair of the right lower leg.  4 sutures are in place.  The remainder of the wound will have to heal secondarily as there was not enough remaining skin to pull this together.  Recommend applying a small amount of  Silvadene  or mupirocin  to the area twice daily and keep covered with a dry dressing.  May use Tylenol  or ibuprofen  for pain.  Have sutures removed in 7 to 10 days.  May come here or go to your primary care provider.  Tetanus is updated today.  Monitor for signs of infection including purulent drainage, redness, increased pain.  Do not submerge the area completely underwater until the sutures are removed.  You may shower and wash the area with soap and water.    ED Prescriptions     Medication Sig Dispense Auth. Provider   silver  sulfADIAZINE  (SILVADENE ) 1 % cream Apply 1 Application topically daily. 85 g Shuaib Corsino A, PA-C   mupirocin  ointment (BACTROBAN ) 2 % Apply 1 Application topically 2 (two) times daily. 22 g Teresa Almarie LABOR, [place]      PDMP not reviewed this encounter.   Teresa Almarie LABOR, PA-C 04/15/24 1149

## 2024-04-15 NOTE — ED Triage Notes (Signed)
 Pt reports having a laceration to RLE after jumping and hitting the area. No active bleeding to the area.   Started: yesterday  Home interventions: cleansed with peroxide, Mupirocin 

## 2024-04-20 ENCOUNTER — Ambulatory Visit: Admitting: Internal Medicine

## 2024-04-20 DIAGNOSIS — F17200 Nicotine dependence, unspecified, uncomplicated: Secondary | ICD-10-CM | POA: Insufficient documentation

## 2024-04-20 DIAGNOSIS — J309 Allergic rhinitis, unspecified: Secondary | ICD-10-CM | POA: Insufficient documentation

## 2024-04-20 DIAGNOSIS — N912 Amenorrhea, unspecified: Secondary | ICD-10-CM | POA: Insufficient documentation

## 2024-04-20 DIAGNOSIS — R87612 Low grade squamous intraepithelial lesion on cytologic smear of cervix (LGSIL): Secondary | ICD-10-CM | POA: Insufficient documentation

## 2024-04-20 NOTE — Progress Notes (Deleted)
 [facility] [address] [place] [address] Dept: [phone] Dept Fax: [phone]    Subjective:   Haley Hogan 1986-05-01 04/20/2024  No chief complaint on file.   HPI: Haley Hogan presents today for re-assessment and management of chronic medical conditions.     The following portions of the patient's history were reviewed and updated as appropriate: past medical history, past surgical history, family history, social history, allergies, medications, and problem list.   Patient Active Problem List   Diagnosis Date Noted   Macrocytosis 12/28/2023   Alcohol abuse 12/28/2023   Thrombocytosis 09/27/2023   Vitamin D  deficiency 06/30/2023   Dysplasia of cervix, 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/25/2023   Folic acid  deficiency 06/21/2023   Abnormal uterine bleeding (AUB) 06/21/2023   Pre-diabetes 06/21/2023   Hyperkalemia 06/21/2023   Trichomoniasis 06/21/2023   Iron deficiency anemia due to chronic blood loss 02/16/2023   Fibroids 02/16/2023   Past Medical History:  Diagnosis Date   Abnormal Pap smear of cervix    Anemia    Fibroids    Hypertension    Past Surgical History:  Procedure Laterality Date   COLPOSCOPY W/ BIOPSY / CURETTAGE  07/29/2023   ENDOMETRIAL BIOPSY  06/21/2023   LEEP     MANDIBLE FRACTURE SURGERY     SHOULDER ARTHROSCOPY     Family History  Problem Relation Age of Onset   Cancer Maternal Grandmother     Current Outpatient Medications:    adapalene  (DIFFERIN ) 0.1 % cream, APPLY TOPICALLY AT BEDTIME, Disp: 45 g, Rfl: 0   cyclobenzaprine  (FLEXERIL ) 10 MG tablet, Take 1 tablet (10 mg total) by mouth 3 (three) times daily as needed for muscle spasms., Disp: 30 tablet, Rfl: 0   cyproheptadine  (PERIACTIN ) 4 MG tablet, Take 1 tablet (4 mg total) by mouth 2 (two) times daily., Disp: 180 tablet, Rfl: 1   ferrous sulfate  325 (65 FE) MG tablet, Take 1 tablet (325 mg total) by mouth daily., Disp: 90  tablet, Rfl: 3   folic acid  (FOLVITE ) 1 MG tablet, Take 1 tablet (1 mg total) by mouth daily., Disp: 90 tablet, Rfl: 3   montelukast  (SINGULAIR ) 10 MG tablet, Take 1 tablet (10 mg total) by mouth at bedtime., Disp: 90 tablet, Rfl: 1   mupirocin  ointment (BACTROBAN ) 2 %, Apply 1 Application topically 2 (two) times daily., Disp: 22 g, Rfl: 1   naproxen  (NAPROSYN ) 500 MG tablet, Take 1 tablet (500 mg total) by mouth 2 (two) times daily as needed., Disp: 30 tablet, Rfl: 0   silver  sulfADIAZINE  (SILVADENE ) 1 % cream, Apply 1 Application topically daily., Disp: 85 g, Rfl: 1   tranexamic acid  (LYSTEDA ) 650 MG TABS tablet, Take 2 tablets (1,300 mg total) by mouth 3 (three) times daily. (Patient not taking: Reported on 04/03/2024), Disp: 30 tablet, Rfl: 2   triamcinolone  cream (KENALOG ) 0.1 %, Apply 1 Application topically 2 (two) times daily., Disp: 45 g, Rfl: 1 Allergies  Allergen Reactions   Bee Venom Hives   Cat Dander Hives    Allergy to cats   Penicillins Rash, Swelling and Other (See Comments)     ROS: A complete ROS was performed with pertinent positives/negatives noted in the HPI. The remainder of the ROS are negative.    Objective:   There were no vitals filed for this visit.  GENERAL: Well-appearing, in NAD. Well nourished.  SKIN: Pink, warm and dry. No rash, lesion, ulceration, or ecchymoses.  NECK: Trachea midline. Full ROM  w/o pain or tenderness. No lymphadenopathy.  RESPIRATORY: Chest wall symmetrical. Respirations even and non-labored. Breath sounds clear to auscultation bilaterally.  CARDIAC: S1, S2 present, regular rate and rhythm. Peripheral pulses 2+ bilaterally.  MSK: Muscle tone and strength appropriate for age. Joints w/o tenderness, redness, or swelling.  EXTREMITIES: Without clubbing, cyanosis, or edema.  NEUROLOGIC: No motor or sensory deficits. Steady, even gait.  PSYCH/MENTAL STATUS: Alert, oriented x 3. Cooperative, appropriate mood and affect.   Health Maintenance  Due  Topic Date Due   COVID-19 Vaccine (1) Never done   Hepatitis B Vaccines 19-59 Average Risk (1 of 3 - 19+ 3-dose series) Never done   HPV VACCINES (1 - Risk 3-dose SCDM series) Never done   Influenza Vaccine  02/25/2024    No results found for any visits on 04/20/24.  The ASCVD Risk score (Arnett DK, et al., 2019) failed to calculate for the following reasons:   The valid HDL cholesterol range is 20 to 100 mg/dL     Assessment & Plan:    There are no diagnoses linked to this encounter. No orders of the defined types were placed in this encounter.  No images are attached to the encounter or orders placed in the encounter. No orders of the defined types were placed in this encounter.   No follow-ups on file.   Rosina Senters, FNP

## 2024-04-25 ENCOUNTER — Ambulatory Visit

## 2024-04-25 ENCOUNTER — Encounter: Payer: Self-pay | Admitting: Internal Medicine

## 2024-04-25 ENCOUNTER — Ambulatory Visit: Admitting: Internal Medicine

## 2024-04-25 VITALS — BP 122/80 | HR 94 | Temp 97.8°F | Ht 66.0 in | Wt 116.2 lb

## 2024-04-25 DIAGNOSIS — S81811D Laceration without foreign body, right lower leg, subsequent encounter: Secondary | ICD-10-CM | POA: Diagnosis not present

## 2024-04-25 DIAGNOSIS — G8929 Other chronic pain: Secondary | ICD-10-CM

## 2024-04-25 DIAGNOSIS — D500 Iron deficiency anemia secondary to blood loss (chronic): Secondary | ICD-10-CM

## 2024-04-25 DIAGNOSIS — M542 Cervicalgia: Secondary | ICD-10-CM | POA: Diagnosis not present

## 2024-04-25 DIAGNOSIS — R7303 Prediabetes: Secondary | ICD-10-CM | POA: Diagnosis not present

## 2024-04-25 NOTE — Progress Notes (Signed)
 [facility] [address] [place] [address] Dept: [phone] Dept Fax: [phone]    Subjective:   Haley Hogan 10-Dec-1985 04/25/2024  Chief Complaint  Patient presents with   Follow-up    6 months pt is fasting want to look at stiches but not removal     HPI:  Haley Hogan is here for follow up visit. She has hx of prediabetes. Well controlled at last visit.   Lab Results  Component Value Date   HGBA1C 4.1 (L) 10/21/2023    She also had 4 sutures placed in urgent care on 04/15/2024 for leg laceration. She does not want sutures to be removed today despite medical advice from PCP to remove them today.   She has hx of IDA due to chronic blood loss from fibroids. She is seen by hematology for iron infusions.  Last CBC Lab Results  Component Value Date   WBC 5.1 04/03/2024   HGB 12.9 04/03/2024   HCT 38.2 04/03/2024   MCV 103.0 (H) 04/03/2024   MCH 34.8 (H) 04/03/2024   RDW 14.8 04/03/2024   PLT 229 04/03/2024    She reports chronic neck pain, being told back in her home state of [place]  that she had bulging disc in her cervical spine.  She would like to get an MRI and/or pursue other interventions such as injections for pain control.    The following portions of the patient's history were reviewed and updated as appropriate: past medical history, past surgical history, family history, social history, allergies, medications, and problem list.   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 cytologic smear 04/20/2024   Tobacco dependence syndrome 04/20/2024   Macrocytosis 12/28/2023   Alcohol abuse 12/28/2023   Thrombocytosis 09/27/2023   Vitamin D  deficiency 06/30/2023   Dysplasia of cervix, 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/25/2023   Folic acid   deficiency 06/21/2023   Abnormal uterine bleeding (AUB) 06/21/2023   Pre-diabetes 06/21/2023   Hyperkalemia 06/21/2023   Iron deficiency anemia due to chronic blood loss 02/16/2023   Fibroids 02/16/2023   Past Medical History:  Diagnosis Date   Abnormal Pap smear of cervix    Anemia    Fibroids    Hypertension    Past Surgical History:  Procedure Laterality Date   COLPOSCOPY W/ BIOPSY / CURETTAGE  07/29/2023   ENDOMETRIAL BIOPSY  06/21/2023   LEEP     MANDIBLE FRACTURE SURGERY     SHOULDER ARTHROSCOPY     Family History  Problem Relation Age of Onset   Cancer Maternal Grandmother     Current Outpatient Medications:    adapalene  (DIFFERIN ) 0.1 % cream, APPLY TOPICALLY AT BEDTIME, Disp: 45 g, Rfl: 0   cyclobenzaprine  (FLEXERIL ) 10 MG tablet, Take 1 tablet (10 mg total) by mouth 3 (three) times daily as needed for muscle spasms., Disp: 30 tablet, Rfl: 0   cyproheptadine  (PERIACTIN ) 4 MG tablet, Take 1 tablet (4 mg total) by mouth 2 (two) times daily., Disp: 180 tablet, Rfl: 1   ferrous sulfate  325 (65 FE) MG tablet, Take 1 tablet (325 mg total) by mouth daily., Disp: 90 tablet, Rfl: 3   folic acid  (FOLVITE ) 1 MG tablet, Take 1 tablet (1 mg total) by mouth daily., Disp: 90 tablet, Rfl: 3   montelukast  (SINGULAIR ) 10 MG tablet, Take 1 tablet (  10 mg total) by mouth at bedtime., Disp: 90 tablet, Rfl: 1   mupirocin  ointment (BACTROBAN ) 2 %, Apply 1 Application topically 2 (two) times daily., Disp: 22 g, Rfl: 1   naproxen  (NAPROSYN ) 500 MG tablet, Take 1 tablet (500 mg total) by mouth 2 (two) times daily as needed., Disp: 30 tablet, Rfl: 0   silver  sulfADIAZINE  (SILVADENE ) 1 % cream, Apply 1 Application topically daily., Disp: 85 g, Rfl: 1   triamcinolone  cream (KENALOG ) 0.1 %, Apply 1 Application topically 2 (two) times daily., Disp: 45 g, Rfl: 1   tranexamic acid  (LYSTEDA ) 650 MG TABS tablet, Take 2 tablets (1,300 mg total) by mouth 3 (three) times daily. (Patient not taking: Reported on  04/03/2024), Disp: 30 tablet, Rfl: 2 Allergies  Allergen Reactions   Bee Venom Hives   Cat Dander Hives    Allergy to cats   Penicillins Rash, Swelling and Other (See Comments)     ROS: A complete ROS was performed with pertinent positives/negatives noted in the HPI. The remainder of the ROS are negative.    Objective:   Today's Vitals   04/25/24 1603  BP: 122/80  Pulse: 94  Temp: 97.8 F (36.6 C)  TempSrc: Temporal  SpO2: 98%  Weight: 116 lb 3.2 oz (52.7 kg)  Height: 5' 6 (1.676 m)    GENERAL: Well-appearing, in NAD. Well nourished.  SKIN: Pink, warm and dry.  Linear laceration to right lower leg with 4 intact interrupted sutures.  Wound appears to be healing well.  No active drainage or redness. NECK: Trachea midline.   RESPIRATORY: Chest wall symmetrical. Respirations even and non-labored. EXTREMITIES: Without clubbing, cyanosis, or edema.  NEUROLOGIC: Steady, even gait.  PSYCH/MENTAL STATUS: Alert, oriented x 3. Cooperative, appropriate mood and affect.   Health Maintenance Due  Topic Date Due   COVID-19 Vaccine (1) Never done   Hepatitis B Vaccines 19-59 Average Risk (1 of 3 - 19+ 3-dose series) Never done   HPV VACCINES (1 - Risk 3-dose SCDM series) Never done    No results found for any visits on 04/25/24.  The ASCVD Risk score (Arnett DK, et al., 2019) failed to calculate for the following reasons:   The valid HDL cholesterol range is 20 to 100 mg/dL     Assessment & Plan:  1. Pre-diabetes (Primary) - Stable, continue to monitor yearly if needed.  2. Iron deficiency anemia due to chronic blood loss -Managed with hematology, CBC remained stable.  3. Chronic neck pain - DG Cervical Spine Complete; Future - Advised patient we will need to obtain x-ray imaging before pursuing any referrals or further imaging.  4. Laceration of right lower extremity, subsequent encounter - Patient has 4 intact simple interrupted sutures to right lower extremity.  These  appear to be well-healing, no redness or active drainage.  Wound closure present. - I advised patient to remove sutures today, patient declines.  She states she will come back on Friday to have them removed.  Orders Placed This Encounter  Procedures   DG Cervical Spine Complete    Standing Status:   Future    Number of Occurrences:   1    Expiration Date:   04/25/2025    Preferred imaging location?:   [facility]             Please include AP, lateral, obliques, and odontoid views.    Reason for exam::   Please include AP, lateral, obliques, and odontoid views.  Please include AP, lateral, obliques, and odontoid views.    Release to patient:   Immediate   No images are attached to the encounter or orders placed in the encounter. No orders of the defined types were placed in this encounter.   Return in about 3 days (around 04/28/2024) for suture removal from leg .   Rosina Senters, FNP

## 2024-04-27 NOTE — Progress Notes (Unsigned)
 Haley Hogan  [birth year]  04/28/2024  Vitals:   04/28/24 1008  BP: 136/86  Pulse: 96  Temp: 97.8 F (36.6 C)  SpO2: 99%    MED LIST:  Current Outpatient Medications  Medication Sig Dispense Refill   adapalene  (DIFFERIN ) 0.1 % cream APPLY TOPICALLY AT BEDTIME 45 g 0   cyclobenzaprine  (FLEXERIL ) 10 MG tablet Take 1 tablet (10 mg total) by mouth 3 (three) times daily as needed for muscle spasms. 30 tablet 0   cyproheptadine  (PERIACTIN ) 4 MG tablet Take 1 tablet (4 mg total) by mouth 2 (two) times daily. 180 tablet 1   ferrous sulfate  325 (65 FE) MG tablet Take 1 tablet (325 mg total) by mouth daily. 90 tablet 3   folic acid  (FOLVITE ) 1 MG tablet Take 1 tablet (1 mg total) by mouth daily. 90 tablet 3   montelukast  (SINGULAIR ) 10 MG tablet Take 1 tablet (10 mg total) by mouth at bedtime. 90 tablet 1   mupirocin  ointment (BACTROBAN ) 2 % Apply 1 Application topically 2 (two) times daily. 22 g 1   naproxen  (NAPROSYN ) 500 MG tablet Take 1 tablet (500 mg total) by mouth 2 (two) times daily as needed. 30 tablet 0   silver  sulfADIAZINE  (SILVADENE ) 1 % cream Apply 1 Application topically daily. 85 g 1   tranexamic acid  (LYSTEDA ) 650 MG TABS tablet Take 2 tablets (1,300 mg total) by mouth 3 (three) times daily. (Patient not taking: Reported on 04/03/2024) 30 tablet 2   triamcinolone  cream (KENALOG ) 0.1 % Apply 1 Application topically 2 (two) times daily. 45 g 1   No current facility-administered medications for this visit.    ALLERGIES: Bee venom, Cat dander, and Penicillins   HPI:   Patient is here for removal of sutures from closed wound. Patient provided verbal consent for suture removal.  Patient denies drainage, fever, redness, or bleeding.   She also has history of vitamin D  deficiency, is currently taking 5000 units of vitamin D3 once daily.  Needs vitamin D  level checked.  She has also not heard from her referrals for dermatology and ENT.  I will provide her with contact  information.   Procedure:  Wounds appears to have healed well. There are no signs of infections. Sutures are removed. Patient given instructions for care and will let us  know if new issue should arise.    Assessment/Plan:  1. Visit for suture removal - Laceration has healed well.  Can continue Neosporin as needed  2. Immunization due (Primary) - Flu vaccine trivalent PF, 6mos and older(Flulaval,Afluria,Fluarix,Fluzone)  3. Vitamin D  deficiency - VITAMIN D  25 Hydroxy (Vit-D Deficiency, Fractures)    Return in about 6 months (around 10/27/2024) for preDM, vitamin D .  Rosina Senters, FNP

## 2024-04-28 ENCOUNTER — Encounter: Payer: Self-pay | Admitting: Internal Medicine

## 2024-04-28 ENCOUNTER — Ambulatory Visit: Admitting: Internal Medicine

## 2024-04-28 VITALS — BP 136/86 | HR 96 | Temp 97.8°F | Ht 66.0 in | Wt 120.8 lb

## 2024-04-28 DIAGNOSIS — Z23 Encounter for immunization: Secondary | ICD-10-CM

## 2024-04-28 DIAGNOSIS — E559 Vitamin D deficiency, unspecified: Secondary | ICD-10-CM

## 2024-04-28 DIAGNOSIS — Z4802 Encounter for removal of sutures: Secondary | ICD-10-CM

## 2024-04-28 LAB — VITAMIN D 25 HYDROXY (VIT D DEFICIENCY, FRACTURES): VITD: 24.15 ng/mL — ABNORMAL LOW (ref 30.00–100.00)

## 2024-04-28 NOTE — Patient Instructions (Addendum)
 [facility]  [address], [place], [place] [address] Phone: [phone]   [facility] Location [address] [place], [address] [place], [place]  [address] [place]  [phone]

## 2024-05-01 ENCOUNTER — Ambulatory Visit: Payer: Self-pay | Admitting: Internal Medicine

## 2024-05-04 ENCOUNTER — Ambulatory Visit: Payer: Self-pay | Admitting: Internal Medicine

## 2024-05-04 DIAGNOSIS — G8929 Other chronic pain: Secondary | ICD-10-CM

## 2024-05-29 ENCOUNTER — Encounter: Payer: Self-pay | Admitting: Radiology

## 2024-06-12 ENCOUNTER — Ambulatory Visit: Admitting: Orthopedic Surgery

## 2024-07-03 ENCOUNTER — Ambulatory Visit: Attending: Oncology | Admitting: Oncology

## 2024-07-03 ENCOUNTER — Telehealth: Payer: Self-pay

## 2024-07-03 ENCOUNTER — Inpatient Hospital Stay: Attending: Oncology

## 2024-07-03 DIAGNOSIS — E538 Deficiency of other specified B group vitamins: Secondary | ICD-10-CM | POA: Insufficient documentation

## 2024-07-03 DIAGNOSIS — N920 Excessive and frequent menstruation with regular cycle: Secondary | ICD-10-CM | POA: Insufficient documentation

## 2024-07-03 DIAGNOSIS — D500 Iron deficiency anemia secondary to blood loss (chronic): Secondary | ICD-10-CM | POA: Insufficient documentation

## 2024-07-03 NOTE — Telephone Encounter (Signed)
 Called pt regarding missed appt today. Pt states she is sick and wants to reschedule. Pt verbalizes understanding that scheduling department will call her

## 2024-07-05 ENCOUNTER — Telehealth: Payer: Self-pay | Admitting: Oncology

## 2024-07-05 NOTE — Telephone Encounter (Signed)
 Called Pt to reschedule appt, day and time confirmed.

## 2024-07-10 ENCOUNTER — Inpatient Hospital Stay

## 2024-07-10 ENCOUNTER — Inpatient Hospital Stay: Admitting: Oncology

## 2024-07-10 VITALS — BP 131/77 | HR 88 | Temp 98.5°F | Resp 15 | Wt 133.5 lb

## 2024-07-10 DIAGNOSIS — N870 Mild cervical dysplasia: Secondary | ICD-10-CM

## 2024-07-10 DIAGNOSIS — E538 Deficiency of other specified B group vitamins: Secondary | ICD-10-CM

## 2024-07-10 DIAGNOSIS — D500 Iron deficiency anemia secondary to blood loss (chronic): Secondary | ICD-10-CM | POA: Diagnosis not present

## 2024-07-10 DIAGNOSIS — D7589 Other specified diseases of blood and blood-forming organs: Secondary | ICD-10-CM

## 2024-07-10 DIAGNOSIS — F1010 Alcohol abuse, uncomplicated: Secondary | ICD-10-CM

## 2024-07-10 DIAGNOSIS — N920 Excessive and frequent menstruation with regular cycle: Secondary | ICD-10-CM | POA: Diagnosis present

## 2024-07-10 LAB — CBC WITH DIFFERENTIAL (CANCER CENTER ONLY)
Abs Immature Granulocytes: 0.02 K/uL (ref 0.00–0.07)
Basophils Absolute: 0 K/uL (ref 0.0–0.1)
Basophils Relative: 0 %
Eosinophils Absolute: 0.2 K/uL (ref 0.0–0.5)
Eosinophils Relative: 3 %
HCT: 37.2 % (ref 36.0–46.0)
Hemoglobin: 12.4 g/dL (ref 12.0–15.0)
Immature Granulocytes: 0 %
Lymphocytes Relative: 27 %
Lymphs Abs: 2 K/uL (ref 0.7–4.0)
MCH: 33.3 pg (ref 26.0–34.0)
MCHC: 33.3 g/dL (ref 30.0–36.0)
MCV: 100 fL (ref 80.0–100.0)
Monocytes Absolute: 0.7 K/uL (ref 0.1–1.0)
Monocytes Relative: 10 %
Neutro Abs: 4.4 K/uL (ref 1.7–7.7)
Neutrophils Relative %: 60 %
Platelet Count: 369 K/uL (ref 150–400)
RBC: 3.72 MIL/uL — ABNORMAL LOW (ref 3.87–5.11)
RDW: 14.5 % (ref 11.5–15.5)
WBC Count: 7.3 K/uL (ref 4.0–10.5)
nRBC: 0 % (ref 0.0–0.2)

## 2024-07-10 LAB — IRON AND TIBC
Iron: 365 ug/dL — ABNORMAL HIGH (ref 28–170)
Saturation Ratios: 70 % — ABNORMAL HIGH (ref 10.4–31.8)
TIBC: 519 ug/dL — ABNORMAL HIGH (ref 250–450)
UIBC: 154 ug/dL

## 2024-07-10 LAB — FOLATE: Folate: 12.8 ng/mL (ref >5.9)

## 2024-07-10 LAB — FERRITIN: Ferritin: 88 ng/mL (ref 11–307)

## 2024-07-10 NOTE — Assessment & Plan Note (Signed)
 Consumes wine every day and brandy on weekends, averaging 8-10 drinks per week. Alcohol consumption may contribute to macrocytosis and poses risks for liver damage, cirrhosis, and various cancers including liver, stomach, and breast cancer. - Advise limiting alcohol consumption to one drink per day to reduce health risks.

## 2024-07-10 NOTE — Assessment & Plan Note (Signed)
 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 Discussed rescheduling when support is available.  - Coordinate with OBGYN for rescheduling hysterectomy when appropriate support is available

## 2024-07-10 NOTE — Assessment & Plan Note (Addendum)
 Low levels of vitamin D and folic acid noted. -Previously sent prescription for folic acid 1 mg daily. -Advised patient to consider over-the-counter multivitamin containing both Vitamin D and B vitamins, depending on cost-effectiveness.

## 2024-07-10 NOTE — Assessment & Plan Note (Signed)
-   Presented with anemia with hemoglobin of 8.3, likely secondary to heavy menstrual bleeding from fibroids and low iron stores. Patient has symptoms of pica (ice craving).  - Iron labs at her OB/GYN office from 06/17/2023 reviewed.  Ferritin 6, iron saturation decreased at 2%, iron decreased at 11, iron binding capacity increased at 727.  Iron labs were indicative of severe iron deficiency.  Since she could not tolerate oral iron, we proceeded with IV iron in December 2024 using Feraheme x 2 doses, 1 week apart.   She was also found to have folate deficiency and has been taking folic acid  1 mg daily.  Significant symptom improvement after receiving IV iron and starting folic acid . Previous pica and cold intolerance resolved. Hemoglobin increased from 8 to 11.9 g/dL, platelets normalized from 724,000 to 423,000.   -She was agreeable to starting oral iron supplements again when she started this from March 2025.  Prescription sent to her pharmacy.  She was advised to take iron supplements with vitamin C to enhance absorption.  Labs today showed stable hemoglobin of 12.4.  Ferritin normal at 88.  Iron studies pending.  Currently no indication for IV iron.  - Continue folic acid   - Consider alternate-day iron dosing if constipation occurs  -Plan to reevaluate her in 6 months with repeat labs including iron studies.

## 2024-07-10 NOTE — Progress Notes (Unsigned)
 [facility]  HEMATOLOGY CLINIC PROGRESS NOTE  PATIENT NAME: Haley Hogan   MR#: [id number] DOB: 04/26/1986  Patient Care Team: Billy Knee, FNP as PCP - General (Internal Medicine) Izell Harari, MD as Consulting Physician (Obstetrics and Gynecology)  Date of visit: 07/10/2024   ASSESSMENT & PLAN:   [name] is a 38 y.o. 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    Iron deficiency anemia due to chronic blood loss - Presented with anemia with hemoglobin of 8.3, likely secondary to heavy menstrual bleeding from fibroids and low iron stores. Patient has symptoms of pica (ice craving).  - Iron labs at her OB/GYN office from 06/17/2023 reviewed.  Ferritin 6, iron saturation decreased at 2%, iron decreased at 11, iron binding capacity increased at 727.  Iron labs were indicative of severe iron deficiency.  Since she could not tolerate oral iron, we proceeded with IV iron in December 2024 using Feraheme x 2 doses, 1 week apart.   She was also found to have folate deficiency and has been taking folic acid  1 mg daily.  Significant symptom improvement after receiving IV iron and starting folic acid . Previous pica and cold intolerance resolved. Hemoglobin increased from 8 to 11.9 g/dL, platelets normalized from 724,000 to 423,000.   -She was agreeable to starting oral iron supplements again when she started this from March 2025.  Prescription sent to her pharmacy.  She was advised to take iron supplements with vitamin C to enhance absorption.  Labs today showed stable hemoglobin of 12.4.  Ferritin normal at 88.  Iron studies pending.  Currently no indication for IV iron.  - Continue folic acid   - Consider alternate-day iron dosing if constipation occurs  -Plan to reevaluate her in 6 months  with repeat labs including iron studies.  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 Discussed rescheduling when support is available.  - She is planning to contact OB/GYN office for appointment in January 2026.  - Coordinate with OBGYN for rescheduling hysterectomy when appropriate support is available  Folic acid  deficiency Low levels of vitamin D  and folic acid  noted on previous visit -Previously sent prescription for folic acid  1 mg daily. -Advised patient to consider over-the-counter multivitamin containing both Vitamin D  and B vitamins, depending on cost-effectiveness.  Macrocytosis Macrocytosis with increased red blood cell size. Alcohol consumption is a possible contributing factor. Folic acid  supplementation is ongoing. - MCV is normal today at 100 - Continue folic acid  supplementation. - Advise limiting alcohol consumption to reduce macrocytosis risk.  Alcohol abuse She has reduced alcohol intake, now consuming wine on approximately four days per week and limiting brandy and vodka to special occasions. Laboratory values have improved, possibly related to decreased alcohol consumption. - Discussed alcohol intake and reduction strategies. - Encouraged continued moderation of alcohol consumption.   I spent a total of 30 minutes during this encounter with the patient including review of chart and various tests results, discussions about plan of care and coordination of care plan.  I reviewed lab results and outside records for this visit and discussed relevant results with the patient. Diagnosis, plan of care and treatment options were also discussed in detail with the patient. Opportunity  provided to ask questions and answers provided to her apparent satisfaction. Provided instructions to call our clinic with any problems, questions or  concerns prior to return visit. I recommended to continue follow-up with PCP and sub-specialists. She verbalized understanding and agreed with the plan. No barriers to learning was detected.  Chinita Patten, MD  07/10/2024 9:46 AM  [facility] OF [facility] [address] [place] [address] Dept: [phone] Dept Fax: [phone]   CHIEF COMPLAINT/ REASON FOR VISIT:  Follow-up for iron deficiency anemia, related to menorrhagia.  Also has folate deficiency.  INTERVAL HISTORY:  Discussed the use of AI scribe software for clinical note transcription with the patient, who gave verbal consent to proceed.  History of Present Illness Haley Hogan is a 38 year old female with iron deficiency anemia and folic acid  deficiency who presents for routine hematology follow-up.  She has been adherent to daily ferrous sulfate , folic acid , and over-the-counter vitamin D  supplementation as previously prescribed. She also takes a multivitamin daily but is considering switching brands due to persistent fatigue and feeling drained, despite ongoing supplementation. She has not previously tried [employer].  Her most recent iron studies were within normal limits, and her folic acid  level was at the lower end of normal. Her vitamin D  level was previously low, as checked by her primary care provider, and she continues daily supplementation.  She has reduced her alcohol intake since her last visit, currently consuming approximately two to three glasses of wine on about four days per week, and only occasionally drinking brandy or vodka, typically during special occasions. She has made efforts to decrease her brandy consumption.  She continues to follow up with her OB GYN every six months, with her last visit in June and plans to schedule her next appointment in January.    SUMMARY OF HEMATOLOGIC HISTORY:  She established care  with OB/GYN physician Dr. Izell on 06/17/2023.  She has a history of uterine fibroids with secondary mass effects including difficulty voiding and constipation, in addition to heavy and painful monthly menses. Patient has been anemic at multiple ED visits with hemoglobin as low as 7 as a consequence of heavy menstrual bleeding but is not currently taking any supplements. Moved from [place]  in 2021 and has full workup including ultrasounds and MRI but has been lost to OBGYN follow up since then.    On 06/17/2023, labs showed hemoglobin of 8.3, hematocrit 30, MCV 79.  White count was 8700 with normal differential, platelet count 774,000.  Ferritin low at 6, iron saturation decreased to 2%, iron binding capacity increased at 727, iron decreased at 11, all indicated of iron deficiency state.  Folic acid  was also decreased at 2.4 ng/mL.  Hepatitis, HIV testing, RPR came back negative.  TSH was within normal limits.  Vitamin D  low at 16.5 ng/mL.  Testing was positive for trichomonas and candida vaginitis.  On Pap smear, she was found to have high risk HPV and low-grade squamous intraepithelial lesion (LSIL).   She has been prescribed iron supplements but has stopped taking them due to constipation. She also has a history of low vitamin D  levels. She has a craving for ice, which can be a symptom of iron deficiency anemia. She has not been experiencing chest pain or shortness of breath. She is concerned about a family history of cancer.   She denies recent chest pain on exertion, shortness of breath on minimal exertion,  pre-syncopal episodes, or palpitations.   She had not noticed any recent bleeding such as epistaxis, hematuria or hematochezia   She had no prior history or diagnosis of cancer.   Since she could not tolerate oral iron, we proceeded with IV iron in December 2024 using Feraheme x 2 doses, 1 week apart.   She was also found to have folate deficiency and has been taking folic acid  1 mg daily.    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 Dr. Izell is planning to proceed with hysterectomy for her.   I have reviewed the past medical history, past surgical history, social history and family history with the patient and they are unchanged from previous note.  ALLERGIES: She is allergic to bee venom, cat dander, and penicillins.  MEDICATIONS:  Current Outpatient Medications  Medication Sig Dispense Refill   adapalene  (DIFFERIN ) 0.1 % cream APPLY TOPICALLY AT BEDTIME 45 g 0   cyclobenzaprine  (FLEXERIL ) 10 MG tablet Take 1 tablet (10 mg total) by mouth 3 (three) times daily as needed for muscle spasms. 30 tablet 0   cyproheptadine  (PERIACTIN ) 4 MG tablet Take 1 tablet (4 mg total) by mouth 2 (two) times daily. 180 tablet 1   ferrous sulfate  325 (65 FE) MG tablet Take 1 tablet (325 mg total) by mouth daily. 90 tablet 3   folic acid  (FOLVITE ) 1 MG tablet Take 1 tablet (1 mg total) by mouth daily. 90 tablet 3   montelukast  (SINGULAIR ) 10 MG tablet Take 1 tablet (10 mg total) by mouth at bedtime. 90 tablet 1   mupirocin  ointment (BACTROBAN ) 2 % Apply 1 Application topically 2 (two) times daily. 22 g 1   naproxen  (NAPROSYN ) 500 MG tablet Take 1 tablet (500 mg total) by mouth 2 (two) times daily as needed. 30 tablet 0   silver  sulfADIAZINE  (SILVADENE ) 1 % cream Apply 1 Application topically daily. 85 g 1   triamcinolone  cream (KENALOG ) 0.1 % Apply 1 Application topically 2 (two) times daily. 45 g 1   tranexamic acid  (LYSTEDA ) 650 MG TABS tablet Take 2 tablets (1,300 mg total) by mouth 3 (three) times daily. (Patient not taking: Reported on 07/10/2024) 30 tablet 2   No current facility-administered medications for this visit.     REVIEW OF SYSTEMS:    ROS  All other pertinent systems were reviewed with the patient and are negative.  PHYSICAL EXAMINATION:   Onc Performance Status - 07/10/24 0925       ECOG Perf Status   ECOG Perf Status Restricted in  physically strenuous activity but ambulatory and able to carry out work of a light or sedentary nature, e.g., light house work, office work      KPS SCALE   KPS % SCORE Able to carry on normal activity, minor s/s of disease           Vitals:   07/10/24 0919  BP: 131/77  Pulse: 88  Resp: 15  Temp: 98.5 F (36.9 C)  SpO2: 100%   Filed Weights   07/10/24 0919  Weight: 133 lb 8 oz (60.6 kg)    Physical Exam Constitutional:      General: She is not in acute distress.    Appearance: Normal appearance.  HENT:     Head: Normocephalic and atraumatic.  Eyes:     General: No scleral icterus.    Conjunctiva/sclera: Conjunctivae normal.  Cardiovascular:     Rate and Rhythm: Normal rate and regular rhythm.  Heart sounds: Normal heart sounds.  Pulmonary:     Effort: Pulmonary effort is normal.     Breath sounds: Normal breath sounds.  Abdominal:     General: There is no distension.  Musculoskeletal:     Right lower leg: No edema.     Left lower leg: No edema.  Neurological:     General: No focal deficit present.     Mental Status: She is alert and oriented to person, place, and time.  Psychiatric:        Mood and Affect: Mood normal.        Behavior: Behavior normal.        Thought Content: Thought content normal.     LABORATORY DATA:   I have reviewed the data as listed.  Results for orders placed or performed in visit on 07/10/24  Ferritin  Result Value Ref Range   Ferritin 88 11 - 307 ng/mL  CBC with Differential (Cancer Center Only)  Result Value Ref Range   WBC Count 7.3 4.0 - 10.5 K/uL   RBC 3.72 (L) 3.87 - 5.11 MIL/uL   Hemoglobin 12.4 12.0 - 15.0 g/dL   HCT 62.7 63.9 - 53.9 %   MCV 100.0 80.0 - 100.0 fL   MCH 33.3 26.0 - 34.0 pg   MCHC 33.3 30.0 - 36.0 g/dL   RDW 85.4 88.4 - 84.4 %   Platelet Count 369 150 - 400 K/uL   nRBC 0.0 0.0 - 0.2 %   Neutrophils Relative % 60 %   Neutro Abs 4.4 1.7 - 7.7 K/uL   Lymphocytes Relative 27 %   Lymphs Abs 2.0  0.7 - 4.0 K/uL   Monocytes Relative 10 %   Monocytes Absolute 0.7 0.1 - 1.0 K/uL   Eosinophils Relative 3 %   Eosinophils Absolute 0.2 0.0 - 0.5 K/uL   Basophils Relative 0 %   Basophils Absolute 0.0 0.0 - 0.1 K/uL   Immature Granulocytes 0 %   Abs Immature Granulocytes 0.02 0.00 - 0.07 K/uL    RADIOGRAPHIC STUDIES: No recent pertinent imaging studies available to review.  No orders of the defined types were placed in this encounter.    Future Appointments  Date Time Provider Department Center  08/14/2024  8:15 AM Georgina Ozell LABOR, MD [facility] None  10/26/2024 10:00 AM Billy Knee, FNP [facility]  01/08/2025  8:30 AM [facility] PHLEBOTOMIST [facility] None  01/08/2025  9:15 AM Branson Kranz, Chinita, MD [facility] None    This document was completed utilizing speech recognition software. Grammatical errors, random word insertions, pronoun errors, and incomplete sentences are an occasional consequence of this system due to software limitations, ambient noise, and hardware issues. Any formal questions or concerns about the content, text or information contained within the body of this dictation should be directly addressed to the provider for clarification.

## 2024-07-10 NOTE — Assessment & Plan Note (Signed)
 Macrocytosis with increased red blood cell size. Alcohol consumption is a possible contributing factor. Folic acid  supplementation is ongoing. - MCV is normal today at 100 - Continue folic acid  supplementation. - Advise limiting alcohol consumption to reduce macrocytosis risk.

## 2024-07-11 ENCOUNTER — Encounter: Payer: Self-pay | Admitting: Oncology

## 2024-08-14 ENCOUNTER — Ambulatory Visit: Admitting: Orthopedic Surgery

## 2024-10-26 ENCOUNTER — Ambulatory Visit: Admitting: Internal Medicine

## 2025-01-08 ENCOUNTER — Inpatient Hospital Stay

## 2025-01-08 ENCOUNTER — Inpatient Hospital Stay: Admitting: Oncology
